# Patient Record
Sex: Male | Born: 1950 | Race: White | Hispanic: No | Smoking: Current every day smoker
Health system: Southern US, Community
[De-identification: ages and names within clinical notes are randomized; demographics above are authoritative.]

## PROBLEM LIST (undated history)

## (undated) DIAGNOSIS — J439 Emphysema, unspecified: Secondary | ICD-10-CM

## (undated) DIAGNOSIS — E785 Hyperlipidemia, unspecified: Secondary | ICD-10-CM

## (undated) DIAGNOSIS — J449 Chronic obstructive pulmonary disease, unspecified: Secondary | ICD-10-CM

## (undated) DIAGNOSIS — J45909 Unspecified asthma, uncomplicated: Secondary | ICD-10-CM

## (undated) HISTORY — PX: BACK SURGERY: SHX140

## (undated) HISTORY — DX: Hyperlipidemia, unspecified: E78.5

---

## 2003-07-12 ENCOUNTER — Emergency Department (HOSPITAL_COMMUNITY): Admission: EM | Admit: 2003-07-12 | Discharge: 2003-07-12 | Payer: Self-pay | Admitting: Emergency Medicine

## 2013-03-14 ENCOUNTER — Emergency Department (HOSPITAL_COMMUNITY): Payer: Self-pay

## 2013-03-14 ENCOUNTER — Emergency Department (HOSPITAL_COMMUNITY)
Admission: EM | Admit: 2013-03-14 | Discharge: 2013-03-14 | Disposition: A | Discharge status: Home / Self Care | Payer: Self-pay | Attending: Emergency Medicine | Admitting: Emergency Medicine

## 2013-03-14 ENCOUNTER — Encounter (HOSPITAL_COMMUNITY): Payer: Self-pay | Admitting: *Deleted

## 2013-03-14 DIAGNOSIS — J439 Emphysema, unspecified: Secondary | ICD-10-CM

## 2013-03-14 DIAGNOSIS — R109 Unspecified abdominal pain: Secondary | ICD-10-CM | POA: Insufficient documentation

## 2013-03-14 DIAGNOSIS — J45901 Unspecified asthma with (acute) exacerbation: Secondary | ICD-10-CM | POA: Insufficient documentation

## 2013-03-14 DIAGNOSIS — R05 Cough: Secondary | ICD-10-CM | POA: Insufficient documentation

## 2013-03-14 DIAGNOSIS — F17200 Nicotine dependence, unspecified, uncomplicated: Secondary | ICD-10-CM | POA: Insufficient documentation

## 2013-03-14 DIAGNOSIS — R059 Cough, unspecified: Secondary | ICD-10-CM | POA: Insufficient documentation

## 2013-03-14 DIAGNOSIS — R011 Cardiac murmur, unspecified: Secondary | ICD-10-CM | POA: Insufficient documentation

## 2013-03-14 DIAGNOSIS — K2990 Gastroduodenitis, unspecified, without bleeding: Secondary | ICD-10-CM | POA: Insufficient documentation

## 2013-03-14 DIAGNOSIS — K2970 Gastritis, unspecified, without bleeding: Secondary | ICD-10-CM | POA: Insufficient documentation

## 2013-03-14 HISTORY — DX: Unspecified asthma, uncomplicated: J45.909

## 2013-03-14 LAB — HEPATIC FUNCTION PANEL
ALT: 17 U/L (ref 0–53)
Alkaline Phosphatase: 74 U/L (ref 39–117)
Bilirubin, Direct: 0.1 mg/dL (ref 0.0–0.3)
Indirect Bilirubin: 0.6 mg/dL (ref 0.3–0.9)

## 2013-03-14 LAB — CBC
HCT: 42.5 % (ref 39.0–52.0)
MCHC: 35.1 g/dL (ref 30.0–36.0)
MCV: 97.7 fL (ref 78.0–100.0)
Platelets: 218 10*3/uL (ref 150–400)
RDW: 14.1 % (ref 11.5–15.5)
WBC: 8 10*3/uL (ref 4.0–10.5)

## 2013-03-14 LAB — BASIC METABOLIC PANEL
BUN: 16 mg/dL (ref 6–23)
Chloride: 104 mEq/L (ref 96–112)
Creatinine, Ser: 1.53 mg/dL — ABNORMAL HIGH (ref 0.50–1.35)
GFR calc Af Amer: 56 mL/min — ABNORMAL LOW (ref >90)
GFR calc non Af Amer: 48 mL/min — ABNORMAL LOW (ref >90)
Potassium: 3.8 mEq/L (ref 3.5–5.1)

## 2013-03-14 LAB — LIPASE, BLOOD: Lipase: 32 U/L (ref 11–59)

## 2013-03-14 LAB — RAPID URINE DRUG SCREEN, HOSP PERFORMED: Barbiturates: NOT DETECTED

## 2013-03-14 LAB — PRO B NATRIURETIC PEPTIDE: Pro B Natriuretic peptide (BNP): 90.7 pg/mL (ref 0–125)

## 2013-03-14 LAB — POCT I-STAT TROPONIN I: Troponin i, poc: 0.01 ng/mL (ref 0.00–0.08)

## 2013-03-14 MED ORDER — IPRATROPIUM BROMIDE 0.02 % IN SOLN
0.5000 mg | Freq: Once | RESPIRATORY_TRACT | Status: AC
  Administered 2013-03-14: 0.5 mg via RESPIRATORY_TRACT
  Filled 2013-03-14: qty 2.5

## 2013-03-14 MED ORDER — RANITIDINE HCL 150 MG PO TABS: 150.0000 mg | ORAL_TABLET | Freq: Two times a day (BID) | ORAL | Status: DC

## 2013-03-14 MED ORDER — ALBUTEROL SULFATE HFA 108 (90 BASE) MCG/ACT IN AERS: 1.0000 | INHALATION_SPRAY | Freq: Four times a day (QID) | RESPIRATORY_TRACT | Status: DC | PRN

## 2013-03-14 MED ORDER — ALBUTEROL SULFATE (5 MG/ML) 0.5% IN NEBU
2.5000 mg | INHALATION_SOLUTION | Freq: Once | RESPIRATORY_TRACT | Status: AC
  Administered 2013-03-14: 2.5 mg via RESPIRATORY_TRACT
  Filled 2013-03-14: qty 0.5

## 2013-03-14 MED ORDER — GI COCKTAIL ~~LOC~~
30.0000 mL | Freq: Once | ORAL | Status: AC
  Administered 2013-03-14: 30 mL via ORAL
  Filled 2013-03-14: qty 30

## 2013-03-14 NOTE — ED Notes (Addendum)
Pt reports sob for over one month, hx of asthma but does not use any inhalers at home. Also having mid chest pains that radiate into his stomach. ekg done at triage, no acute distress noted at this time. Reports last using cocaine two days ago.

## 2013-03-14 NOTE — ED Notes (Signed)
Ambulated pt. Pt stated that he felt fine while up walking around, Pt's O2 stayed at 95 HR stayed at 98

## 2013-03-14 NOTE — ED Provider Notes (Signed)
History     CSN: [id number]  Arrival date & time 03/14/13  1501   First MD Initiated Contact with Patient 03/14/13 1516      Chief Complaint  Patient presents with  . Chest Pain  . Shortness of Breath    (Consider location/radiation/quality/duration/timing/severity/associated sxs/prior treatment) HPI Comments: Pt with no medical hx comes in with cc of chest pain, abd pain dib. Pt admits to smoking 1 pack/day since age 62. He also admits to using crack occasionally, last use was 2 weeks ago. Pt reports having epigastric abd/chest pain and periumbilical abd pain for the past few days. The pain is constant, with intermittent worsening. The pain is not exertional, not worse with inspiration. Pt has a productive cough. No fevers, chills. He does admit to DIB, now walking just from one room to another gets him short of breath. He has no wheezing. Pt has no hx of PE, DVT, and no risk factors for the same. He does admit to some orthopnea, but there is no leg swelling, pnd. Pt doesn't see a physician regularly.  Patient is a 62 y.o. male presenting with chest pain and shortness of breath. The history is provided by the patient.  Chest Pain Associated symptoms: cough and shortness of breath   Associated symptoms: no dizziness, no fever and no headache   Shortness of Breath Associated symptoms: chest pain and cough   Associated symptoms: no fever, no headaches, no neck pain and no wheezing     Past Medical History  Diagnosis Date  . Asthma     History reviewed. No pertinent past surgical history.  History reviewed. No pertinent family history.  History  Substance Use Topics  . Smoking status: Current Every Day Smoker    Types: Cigarettes  . Smokeless tobacco: Not on file  . Alcohol Use: No     Comment: former alcoholic      Review of Systems  Constitutional: Negative for fever, chills and activity change.  HENT: Negative for neck pain.   Eyes: Negative for visual disturbance.   Respiratory: Positive for cough and shortness of breath. Negative for chest tightness and wheezing.   Cardiovascular: Positive for chest pain.  Gastrointestinal: Negative for abdominal distention.  Genitourinary: Negative for dysuria, enuresis and difficulty urinating.  Musculoskeletal: Negative for arthralgias.  Neurological: Negative for dizziness, light-headedness and headaches.  Psychiatric/Behavioral: Negative for confusion.    Allergies  Review of patient's allergies indicates no known allergies.  Home Medications  No current outpatient prescriptions on file.  BP 122/73  Pulse 59  Temp(Src) 98.7 F (37.1 C) (Oral)  Resp 21  SpO2 98%  Physical Exam  Nursing note and vitals reviewed. Constitutional: He is oriented to person, place, and time. He appears well-developed.  HENT:  Head: Normocephalic and atraumatic.  Eyes: Conjunctivae and EOM are normal. Pupils are equal, round, and reactive to light.  Neck: Normal range of motion. Neck supple. No JVD present.  Cardiovascular: Normal rate, regular rhythm and intact distal pulses.   Murmur heard. Pulmonary/Chest: Effort normal and breath sounds normal. No respiratory distress. He has no wheezes. He has no rales.  Abdominal: Soft. Bowel sounds are normal. He exhibits no distension. There is no tenderness. There is no rebound and no guarding.  Musculoskeletal: He exhibits no edema and no tenderness.  Neurological: He is alert and oriented to person, place, and time.  Skin: Skin is warm.    ED Course  Procedures (including critical care time)  Labs Reviewed  CBC -  Abnormal; Notable for the following:    MCH 34.3 (*)    All other components within normal limits  BASIC METABOLIC PANEL - Abnormal; Notable for the following:    Glucose, Bld 100 (*)    Creatinine, Ser 1.53 (*)    GFR calc non Af Amer 48 (*)    GFR calc Af Amer 56 (*)    All other components within normal limits  PRO B NATRIURETIC PEPTIDE  HEPATIC FUNCTION  PANEL  LIPASE, BLOOD  URINE RAPID DRUG SCREEN (HOSP PERFORMED)  POCT I-STAT TROPONIN I   Dg Chest 2 View  03/14/2013   *RADIOLOGY REPORT*  Clinical Data: Chest pain and shortness of breath.  Cough.  CHEST - 2 VIEW  Comparison: None available.  Findings: The heart size is normal.  Mild emphysematous changes are present.  No focal airspace disease is evident.  The visualized soft tissues and bony thorax are unremarkable.  IMPRESSION: 1.  Mild emphysema. 2.  No acute cardiopulmonary disease.   Original Report Authenticated By: Marin Roberts, M.D.   US Aorta  03/14/2013   *RADIOLOGY REPORT*  Clinical Data:  62 year old male with abdominal pain.  ULTRASOUND OF ABDOMINAL AORTA  Technique:  Ultrasound examination of the abdominal aorta was performed to evaluate for abdominal aortic aneurysm.  Comparison: None  Abdominal Aorta:  Mild ectasia of the proximal abdominal aorta is noted without aneurysm identified.        Maximum AP diameter:  2.2 cm.       Maximum TRV diameter:  2.2 cm.  IMPRESSION: No abdominal aortic aneurysm identified.   Original Report Authenticated By: Harmon Pier, M.D.     No diagnosis found.    MDM   Date: 03/14/2013  Rate: 79  Rhythm: normal sinus rhythm  QRS Axis: normal  Intervals: normal  ST/T Wave abnormalities: normal  Conduction Disutrbances: none  Narrative Interpretation: unremarkable   Differential diagnosis includes: ACS syndrome CHF exacerbation Valvular disorder Myocarditis Pericarditis Pericardial effusion Pneumonia Pleural effusion Pulmonary edema PE Anemia Musculoskeletal pain PUD/Gastritis COPD/Emphysema Pancreatitis  Pt comes in with cc of epigastric chest pain, abd pain - periumbilical and DIB. Pt has extensive smoking hx and cocaine use - which along with his age are the only cardiac risk factors he has. His initial ekg is WNL. Pt's exam is not indicative of fluid overload. No wheezing on lung exam -poor air entry appreciated. Will  get BNP, Troponin, LFTs, CXR and reassess.  7:45 PM Pt appears to have emphysema. BNP is nml, troponin x 1 is normal. 2nd trop pending. AAA was in the ddx with abd pain and smoking hx - that shows no AAA.  Will d/c with pulmonology f/u. Will give GI f/u as well and start on zantac.  The patient was counseled on the dangers of tobacco use, and was advised to quit.  Reviewed strategies to maximize success, including removing cigarettes and smoking materials from environment and support of family/friends.    Derwood Kaplan, MD 03/14/13 1947

## 2014-09-06 IMAGING — US US AORTA
1 series · 14 of 15 positions shown · non-contrast
Comparison: None

CLINICAL DATA: 61-year-old male with abdominal pain.

ULTRASOUND OF ABDOMINAL AORTA
TECHNIQUE: Ultrasound examination of the abdominal aorta was
performed to evaluate for abdominal aortic aneurysm.

[Series 1: us aorta · 0.26mm/px · 14 of 15 slices shown]
[im 1/15]
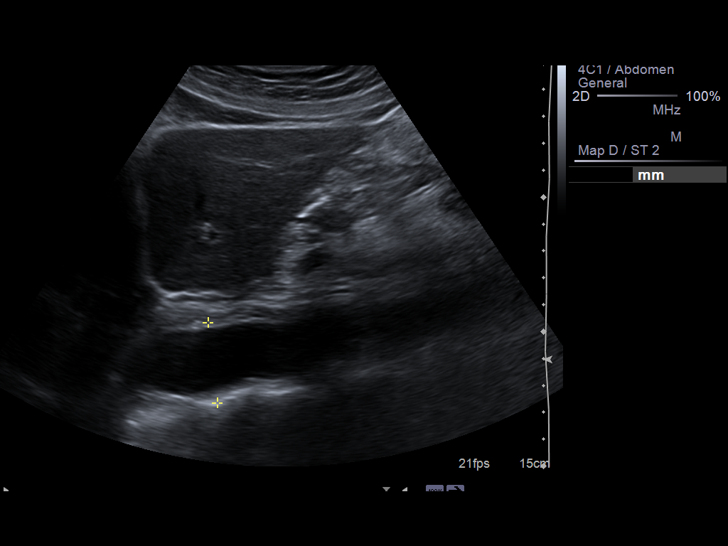
[im 2/15]
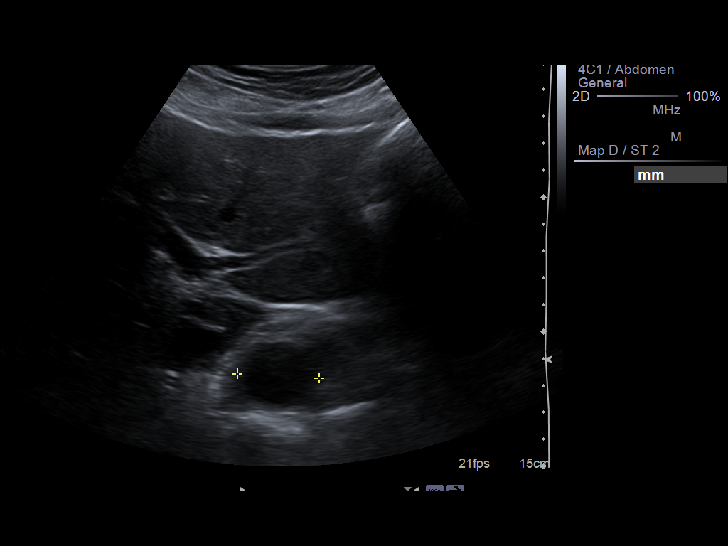
[im 3/15]
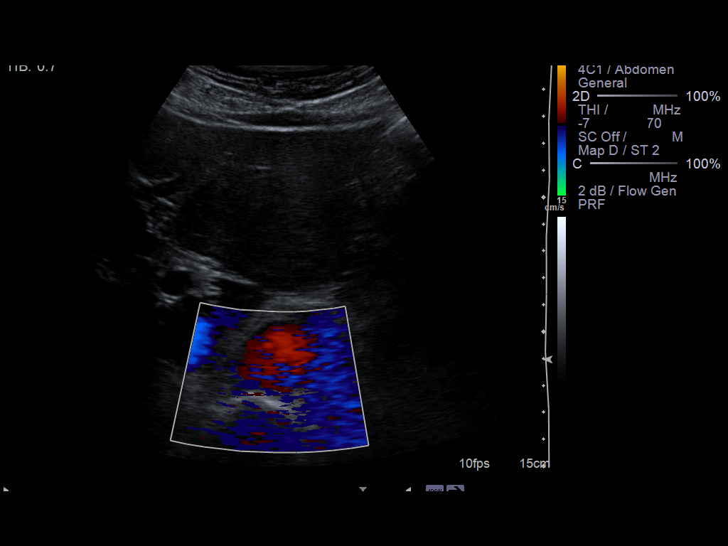
[im 4/15]
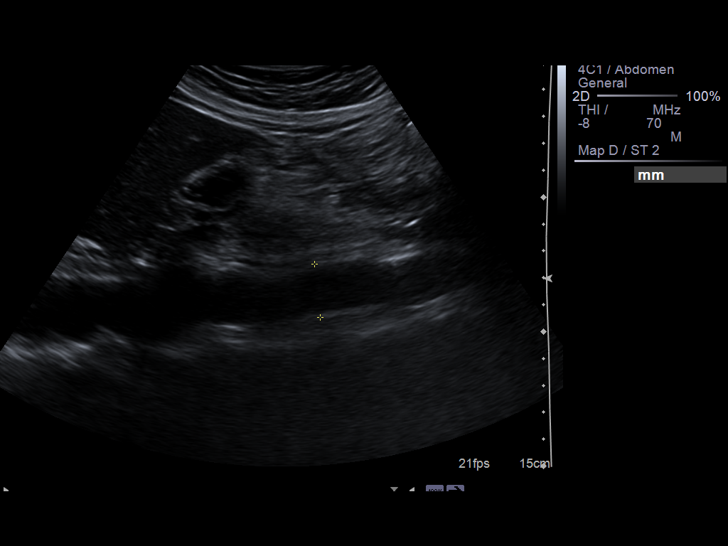
[im 5/15]
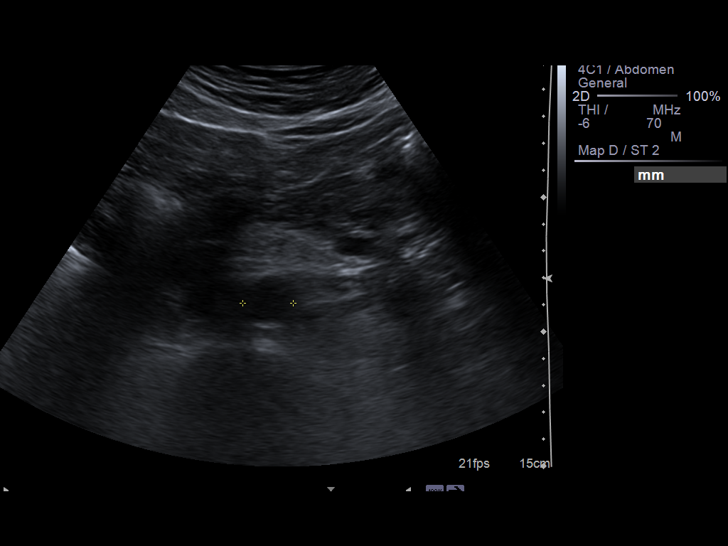
[im 6/15]
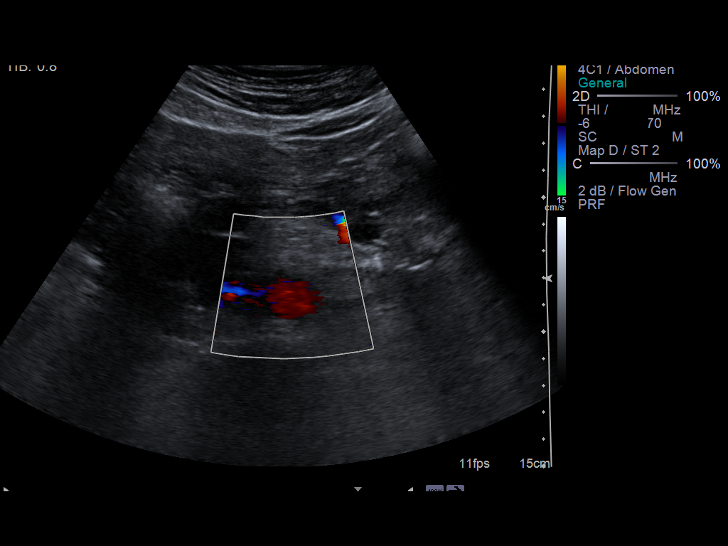
[im 7/15]
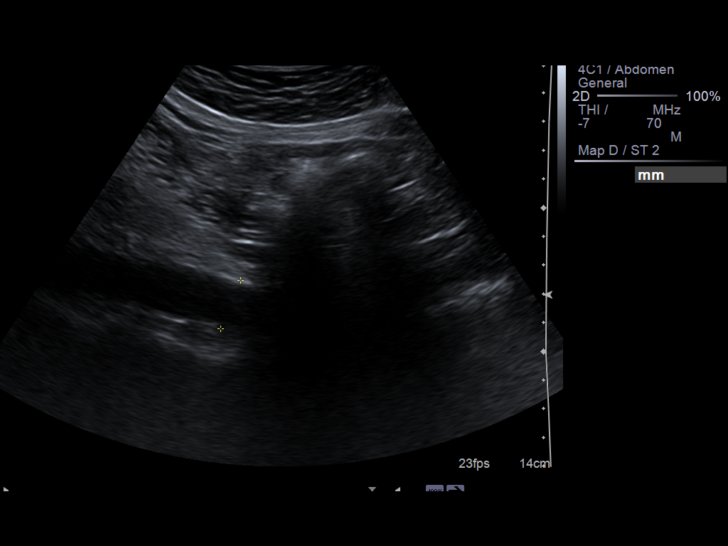
[im 9/15]
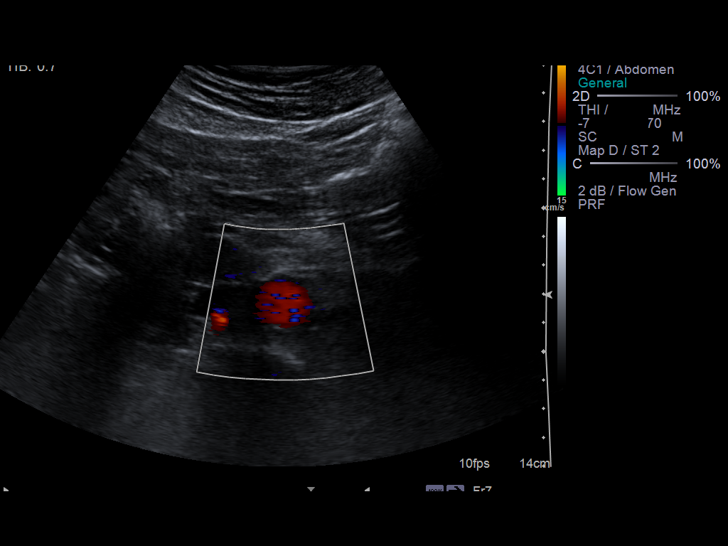
[im 10/15]
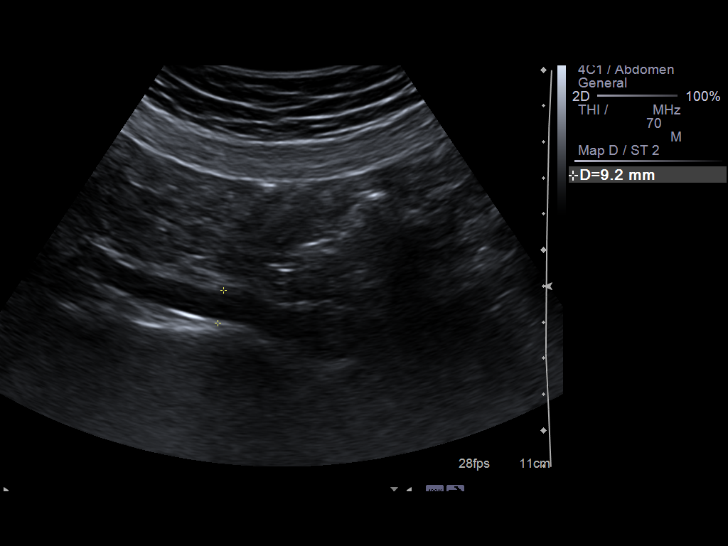
[im 11/15]
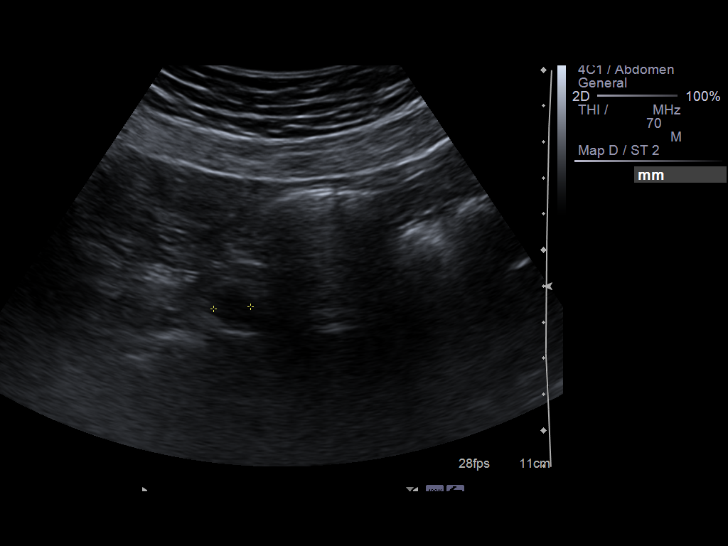
[im 12/15]
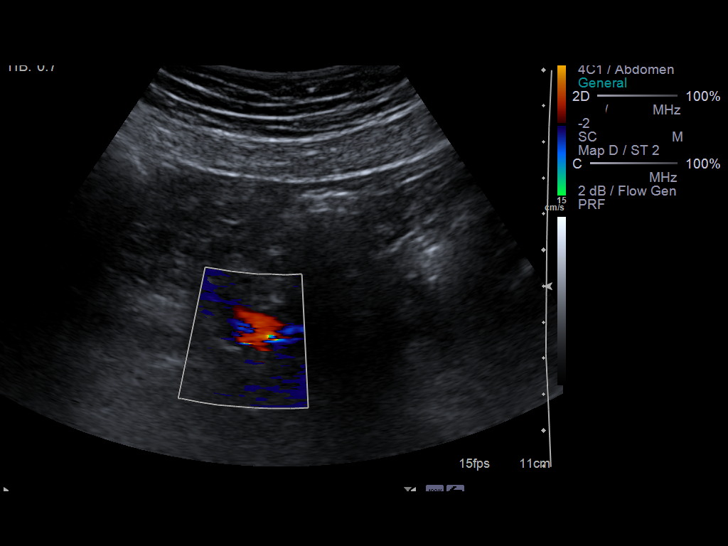
[im 13/15]
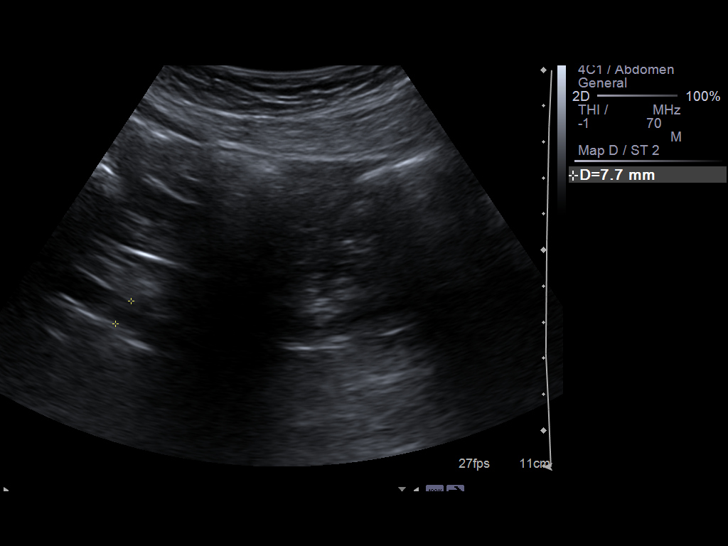
[im 14/15]
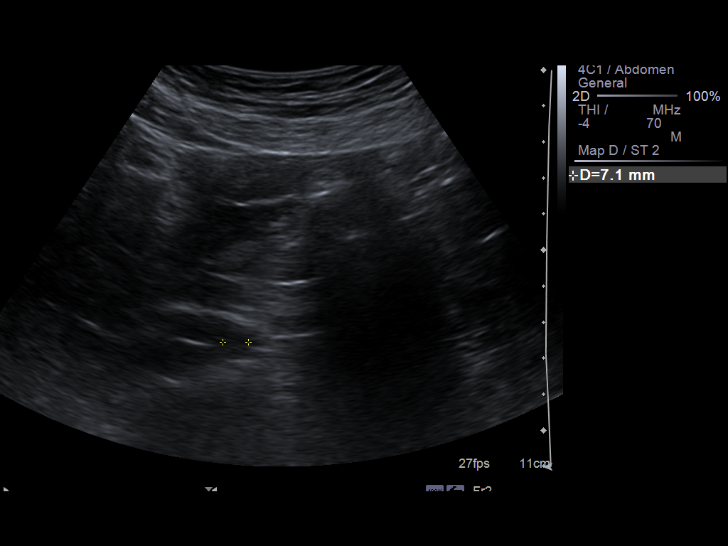
[im 15/15]
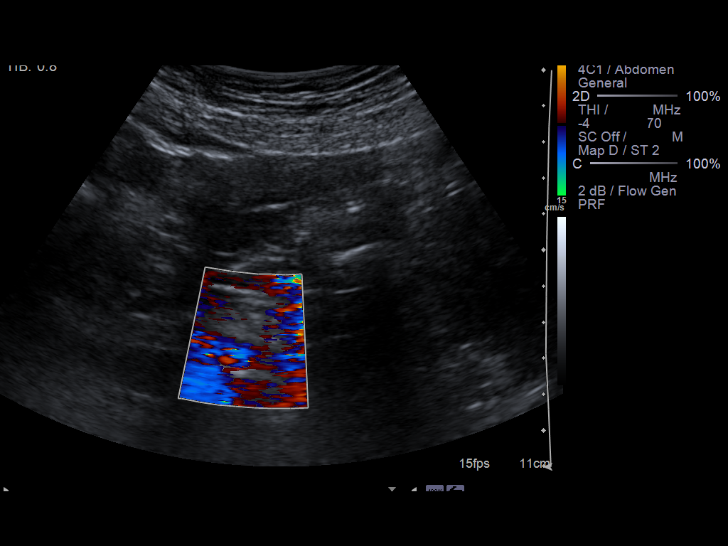

[14 of 15 positions shown; findings below may reference images not displayed]

Abdominal Aorta:  Mild ectasia of the proximal abdominal aorta is
noted without aneurysm identified.

      Maximum AP diameter:  2.2 cm.
      Maximum TRV diameter:  2.2 cm.
IMPRESSION: No abdominal aortic aneurysm identified.

## 2021-01-14 ENCOUNTER — Emergency Department
Admission: EM | Admit: 2021-01-14 | Discharge: 2021-01-14 | Discharge status: Against Medical Advice | Payer: Medicare Other | Attending: Student in an Organized Health Care Education/Training Program | Admitting: Student in an Organized Health Care Education/Training Program

## 2021-01-14 ENCOUNTER — Other Ambulatory Visit: Payer: Self-pay

## 2021-01-14 ENCOUNTER — Emergency Department: Payer: Medicare Other

## 2021-01-14 ENCOUNTER — Encounter: Payer: Self-pay | Admitting: Emergency Medicine

## 2021-01-14 DIAGNOSIS — J449 Chronic obstructive pulmonary disease, unspecified: Secondary | ICD-10-CM | POA: Insufficient documentation

## 2021-01-14 DIAGNOSIS — R0602 Shortness of breath: Secondary | ICD-10-CM | POA: Diagnosis not present

## 2021-01-14 DIAGNOSIS — F17210 Nicotine dependence, cigarettes, uncomplicated: Secondary | ICD-10-CM | POA: Insufficient documentation

## 2021-01-14 DIAGNOSIS — J45909 Unspecified asthma, uncomplicated: Secondary | ICD-10-CM | POA: Insufficient documentation

## 2021-01-14 HISTORY — DX: Chronic obstructive pulmonary disease, unspecified: J44.9

## 2021-01-14 HISTORY — DX: Emphysema, unspecified: J43.9

## 2021-01-14 LAB — CBC
HCT: 39.2 % (ref 39.0–52.0)
Hemoglobin: 13.4 g/dL (ref 13.0–17.0)
MCH: 33 pg (ref 26.0–34.0)
MCHC: 34.2 g/dL (ref 30.0–36.0)
MCV: 96.6 fL (ref 80.0–100.0)
Platelets: 246 10*3/uL (ref 150–400)
RBC: 4.06 MIL/uL — ABNORMAL LOW (ref 4.22–5.81)
RDW: 13.9 % (ref 11.5–15.5)
WBC: 6.8 10*3/uL (ref 4.0–10.5)
nRBC: 0 % (ref 0.0–0.2)

## 2021-01-14 LAB — COMPREHENSIVE METABOLIC PANEL
ALT: 19 U/L (ref 0–44)
AST: 26 U/L (ref 15–41)
Albumin: 4.1 g/dL (ref 3.5–5.0)
Alkaline Phosphatase: 50 U/L (ref 38–126)
Anion gap: 9 (ref 5–15)
BUN: 20 mg/dL (ref 8–23)
CO2: 23 mmol/L (ref 22–32)
Calcium: 9.3 mg/dL (ref 8.9–10.3)
Chloride: 104 mmol/L (ref 98–111)
Creatinine, Ser: 1.44 mg/dL — ABNORMAL HIGH (ref 0.61–1.24)
GFR, Estimated: 53 mL/min — ABNORMAL LOW (ref >60)
Glucose, Bld: 108 mg/dL — ABNORMAL HIGH (ref 70–99)
Potassium: 4.3 mmol/L (ref 3.5–5.1)
Sodium: 136 mmol/L (ref 135–145)
Total Bilirubin: 0.6 mg/dL (ref 0.3–1.2)
Total Protein: 7.5 g/dL (ref 6.5–8.1)

## 2021-01-14 LAB — TROPONIN I (HIGH SENSITIVITY): Troponin I (High Sensitivity): 8 ng/L (ref <18)

## 2021-01-14 LAB — BRAIN NATRIURETIC PEPTIDE: B Natriuretic Peptide: 37.4 pg/mL (ref 0.0–100.0)

## 2021-01-14 MED ORDER — IPRATROPIUM-ALBUTEROL 0.5-2.5 (3) MG/3ML IN SOLN
3.0000 mL | Freq: Once | RESPIRATORY_TRACT | Status: AC
  Administered 2021-01-14: 3 mL via RESPIRATORY_TRACT
  Filled 2021-01-14: qty 3

## 2021-01-14 MED ORDER — ALBUTEROL SULFATE HFA 108 (90 BASE) MCG/ACT IN AERS: 1.0000 | INHALATION_SPRAY | Freq: Four times a day (QID) | RESPIRATORY_TRACT | 0 refills | Status: DC | PRN

## 2021-01-14 MED ORDER — PREDNISONE 20 MG PO TABS: 40.0000 mg | ORAL_TABLET | Freq: Every day | ORAL | 0 refills | Status: AC

## 2021-01-14 MED ORDER — METHYLPREDNISOLONE SODIUM SUCC 125 MG IJ SOLR
125.0000 mg | Freq: Once | INTRAMUSCULAR | Status: AC
  Administered 2021-01-14: 125 mg via INTRAVENOUS
  Filled 2021-01-14: qty 2

## 2021-01-14 NOTE — ED Provider Notes (Signed)
[facility] Emergency Department Provider Note    Event Date/Time   First MD Initiated Contact with Patient 01/14/21 1138     (approximate)  I have reviewed the triage vital signs and the nursing notes.   HISTORY  Chief Complaint Chest Pain and Shortness of Breath    HPI Terry French is a 70 y.o. male below listed past medical history presents to the ER for worsening shortness of breath over the past few weeks becoming acutely worse this morning.  Is not been on any antibiotics or steroids recently.  States he is got 4 inhalers but he only uses them every now and then and does not feel like they are helping much.  Cannot provide any details as to what inhalers she has been using at home.  Denies any fevers or chills.  No known sick contacts.  No leg swelling.  No orthopnea.  Can feel himself wheezing.    Past Medical History:  Diagnosis Date  . Asthma   . COPD (chronic obstructive pulmonary disease) ([place])   . Emphysema lung ([place])    No family history on file. Past Surgical History:  Procedure Laterality Date  . BACK SURGERY     There are no problems to display for this patient.     Prior to Admission medications   Medication Sig Start Date End Date Taking? Authorizing Provider  predniSONE (DELTASONE) 20 MG tablet Take 2 tablets (40 mg total) by mouth daily for 5 days. 01/14/21 01/19/21 Yes Merlyn Lot, MD  albuterol (VENTOLIN HFA) 108 (90 Base) MCG/ACT inhaler Inhale 1-2 puffs into the lungs every 6 (six) hours as needed for wheezing. 01/14/21   Merlyn Lot, MD  ranitidine (ZANTAC) 150 MG tablet Take 1 tablet (150 mg total) by mouth 2 (two) times daily. 03/14/13   Varney Biles, MD    Allergies Patient has no known allergies.    Social History Social History   Tobacco Use  . Smoking status: Current Every Day Smoker    Types: Cigarettes  Substance Use Topics  . Alcohol use: No    Comment: former alcoholic  . Drug use: Yes     Types: Marijuana, Cocaine    Review of Systems Patient denies headaches, rhinorrhea, blurry vision, numbness, shortness of breath, chest pain, edema, cough, abdominal pain, nausea, vomiting, diarrhea, dysuria, fevers, rashes or hallucinations unless otherwise stated above in HPI. ____________________________________________   PHYSICAL EXAM:  VITAL SIGNS: Vitals:   01/14/21 1145 01/14/21 1311  BP: (!) 144/83   Pulse: 74   Resp: 16   Temp:    SpO2: 95% 97%    Constitutional: Alert and oriented.  Eyes: Conjunctivae are normal.  Head: Atraumatic. Nose: No congestion/rhinnorhea. Mouth/Throat: Mucous membranes are moist.   Neck: No stridor. Painless ROM.  Cardiovascular: Normal rate, regular rhythm. Grossly normal heart sounds.  Good peripheral circulation. Respiratory: mild tachypnea, prolonged exp phase, diffuse expiratory wheeze throughout Gastrointestinal: Soft and nontender. No distention. No abdominal bruits. No CVA tenderness. Genitourinary:  Musculoskeletal: No lower extremity tenderness nor edema.  No joint effusions. Neurologic:  Normal speech and language. No gross focal neurologic deficits are appreciated. No facial droop Skin:  Skin is warm, dry and intact. No rash noted. Psychiatric: Mood and affect are normal. Speech and behavior are normal.  ____________________________________________   LABS (all labs ordered are listed, but only abnormal results are displayed)  Results for orders placed or performed during the hospital encounter of 01/14/21 (from the past 24 hour(s))  CBC     Status: Abnormal   Collection Time: 01/14/21 11:43 AM  Result Value Ref Range   WBC 6.8 4.0 - 10.5 K/uL   RBC 4.06 (L) 4.22 - 5.81 MIL/uL   Hemoglobin 13.4 13.0 - 17.0 g/dL   HCT 39.2 39.0 - 52.0 %   MCV 96.6 80.0 - 100.0 fL   MCH 33.0 26.0 - 34.0 pg   MCHC 34.2 30.0 - 36.0 g/dL   RDW 13.9 11.5 - 15.5 %   Platelets 246 150 - 400 K/uL   nRBC 0.0 0.0 - 0.2 %  Comprehensive  metabolic panel     Status: Abnormal   Collection Time: 01/14/21 11:43 AM  Result Value Ref Range   Sodium 136 135 - 145 mmol/L   Potassium 4.3 3.5 - 5.1 mmol/L   Chloride 104 98 - 111 mmol/L   CO2 23 22 - 32 mmol/L   Glucose, Bld 108 (H) 70 - 99 mg/dL   BUN 20 8 - 23 mg/dL   Creatinine, Ser 1.44 (H) 0.61 - 1.24 mg/dL   Calcium 9.3 8.9 - 10.3 mg/dL   Total Protein 7.5 6.5 - 8.1 g/dL   Albumin 4.1 3.5 - 5.0 g/dL   AST 26 15 - 41 U/L   ALT 19 0 - 44 U/L   Alkaline Phosphatase 50 38 - 126 U/L   Total Bilirubin 0.6 0.3 - 1.2 mg/dL   GFR, Estimated 53 (L) >60 mL/min   Anion gap 9 5 - 15  Brain natriuretic peptide     Status: None   Collection Time: 01/14/21 11:44 AM  Result Value Ref Range   B Natriuretic Peptide 37.4 0.0 - 100.0 pg/mL  Troponin I (High Sensitivity)     Status: None   Collection Time: 01/14/21 11:45 AM  Result Value Ref Range   Troponin I (High Sensitivity) 8 <18 ng/L   ____________________________________________  EKG My review and personal interpretation at Time: 11:22   Indication: sob  Rate: 90  Rhythm: sinus Axis: normal Other: nonspecific st abn, no stemi ____________________________________________  RADIOLOGY  I personally reviewed all radiographic images ordered to evaluate for the above acute complaints and reviewed radiology reports and findings.  These findings were personally discussed with the patient.  Please see medical record for radiology report.  ____________________________________________   PROCEDURES  Procedure(s) performed:  Procedures    Critical Care performed: no ____________________________________________   INITIAL IMPRESSION / ASSESSMENT AND PLAN / ED COURSE  Pertinent labs & imaging results that were available during my care of the patient were reviewed by me and considered in my medical decision making (see chart for details).   DDX: Asthma, copd, CHF, pna, ptx, malignancy, Pe, anemia   WESLY WHISENANT is a 70 y.o.  who presents to the ED with presentation as described above.  Is clinical exam is most consistent with COPD emphysema.  EKG nonspecific changes.  I will give nebs as well as steroids.  Given his age and risk factors will order cardiac markers as well.  Have a lower suspicion for PE.  The patient will be placed on continuous pulse oximetry and telemetry for monitoring.  Laboratory evaluation will be sent to evaluate for the above complaints.     Clinical Course as of 01/14/21 1326  Fri Jan 14, 2021  1324 Patient notified nurse that he wants to leave.  I went and talked to the patient and based on his symptoms I did recommend the patient stay for further observation  serial enzymes cardiac monitoring make sure that his symptoms are improving.  Patient states that he is unwilling to wait and like to go home but does request a prescription for inhaler and prednisone.  For the patient that I I will prescribe these medications but leaving at this point in the work-up would be AGAINST MEDICAL ADVICE as I cannot exclude more insidious pathology such as cardiac etiology or worsening of his COPD in the leaving at this point could result in worsening of his symptoms, significant deterioration and even death.  Patient demonstrates understanding of this and states that he will sign out AMA. [PR]    Clinical Course User Index [PR] Merlyn Lot, MD    The patient was evaluated in Emergency Department today for the symptoms described in the history of present illness. He/she was evaluated in the context of the global COVID-19 pandemic, which necessitated consideration that the patient might be at risk for infection with the SARS-CoV-2 virus that causes COVID-19. Institutional protocols and algorithms that pertain to the evaluation of patients at risk for COVID-19 are in a state of rapid change based on information released by regulatory bodies including the CDC and federal and state organizations. These policies and  algorithms were followed during the patient's care in the ED.  As part of my medical decision making, I reviewed the following data within the [place] notes reviewed and incorporated, Labs reviewed, notes from prior ED visits and [place] Controlled Substance Database   ____________________________________________   FINAL CLINICAL IMPRESSION(S) / ED DIAGNOSES  Final diagnoses:  Shortness of breath      NEW MEDICATIONS STARTED DURING THIS VISIT:  New Prescriptions   PREDNISONE (DELTASONE) 20 MG TABLET    Take 2 tablets (40 mg total) by mouth daily for 5 days.     Note:  This document was prepared using Dragon voice recognition software and may include unintentional dictation errors.    Merlyn Lot, MD 01/14/21 1327

## 2021-01-14 NOTE — ED Triage Notes (Signed)
Pt c/o substernal CP that has been intermittent x 3 weeks. Pt states burning sensation to his lungs. Pt states hx of COPD and emphysema, pt noted to be tachypneic on arrival to ED.

## 2021-01-27 ENCOUNTER — Ambulatory Visit (INDEPENDENT_AMBULATORY_CARE_PROVIDER_SITE_OTHER): Payer: Medicare Other | Admitting: Nurse Practitioner

## 2021-01-27 ENCOUNTER — Encounter: Payer: Self-pay | Admitting: Nurse Practitioner

## 2021-01-27 ENCOUNTER — Ambulatory Visit
Admission: RE | Admit: 2021-01-27 | Discharge: 2021-01-27 | Disposition: A | Discharge status: Home / Self Care | Payer: Medicare Other | Source: Ambulatory Visit | Attending: Nurse Practitioner | Admitting: Nurse Practitioner

## 2021-01-27 ENCOUNTER — Other Ambulatory Visit: Payer: Self-pay

## 2021-01-27 ENCOUNTER — Ambulatory Visit
Admission: RE | Admit: 2021-01-27 | Discharge: 2021-01-27 | Disposition: A | Discharge status: Home / Self Care | Payer: Medicare Other | Source: Home / Self Care | Attending: Nurse Practitioner | Admitting: Nurse Practitioner

## 2021-01-27 VITALS — BP 140/79 | HR 77 | Temp 97.3°F | Ht 65.47 in | Wt 154.2 lb

## 2021-01-27 DIAGNOSIS — J441 Chronic obstructive pulmonary disease with (acute) exacerbation: Secondary | ICD-10-CM

## 2021-01-27 DIAGNOSIS — Z7689 Persons encountering health services in other specified circumstances: Secondary | ICD-10-CM | POA: Diagnosis not present

## 2021-01-27 MED ORDER — SPIRIVA RESPIMAT 2.5 MCG/ACT IN AERS: 2.0000 | INHALATION_SPRAY | Freq: Every day | RESPIRATORY_TRACT | 1 refills | Status: DC

## 2021-01-27 MED ORDER — ALBUTEROL SULFATE HFA 108 (90 BASE) MCG/ACT IN AERS: 1.0000 | INHALATION_SPRAY | Freq: Four times a day (QID) | RESPIRATORY_TRACT | 1 refills | Status: DC | PRN

## 2021-01-27 NOTE — Progress Notes (Signed)
BP 140/79   Pulse 77   Temp (!) 97.3 F (36.3 C)   Ht 5' 5.47" (1.663 m)   Wt 154 lb 4 oz (70 kg)   SpO2 97%   BMI 25.30 kg/m    Subjective:    Patient ID: Terry French, male    DOB: 1951/07/09, 70 y.o.   MRN: [id number]  HPI: Terry French is a 70 y.o. male  Chief Complaint  Patient presents with  . Establish Care  . COPD   Patient presents to clinic to establish care with new PCP.  Patient reports a history of COPD has been a smoker for about 44 years.  Patient reports that he drinks 3-4 cups of coffee everyday.  He does drink a lot of water also.  Patient was a former alcoholic and cocaine abuse.  Currently only smokes marjiuana.   Patient denies a history of: Hypertension, Elevated Cholesterol, Thyroid problems, Diabetes, Depression, Anxiety, Neurological problems, and Abdominal problems.    Denies HA, CP, dizziness, palpitations, visual changes, and lower extremity swelling.   COPD Completed course of steroids from the hospital. It was recommended that he stay for observation but he left AMA. States breathing has improved some but not completely.  He does have SOB without having an exacerbation.  COPD status: uncontrolled Satisfied with current treatment?: no current treatment Oxygen use: no Dyspnea frequency: yes Cough frequency: yes Rescue inhaler frequency:  Whenever he needs Limitation of activity: yes Productive cough: yes Last Spirometry: Never Pneumovax: not up to date but will get in the future. Influenza: Not up to Date  Relevant past medical, surgical, family and social history reviewed and updated as indicated. Interim medical history since our last visit reviewed. Allergies and medications reviewed and updated.  Review of Systems  Eyes: Negative for visual disturbance.  Respiratory: Positive for shortness of breath.   Cardiovascular: Negative for chest pain and leg swelling.  Neurological: Negative for light-headedness and headaches.    Per HPI  unless specifically indicated above     Objective:    BP 140/79   Pulse 77   Temp (!) 97.3 F (36.3 C)   Ht 5' 5.47" (1.663 m)   Wt 154 lb 4 oz (70 kg)   SpO2 97%   BMI 25.30 kg/m   Wt Readings from Last 3 Encounters:  01/27/21 154 lb 4 oz (70 kg)  01/14/21 160 lb (72.6 kg)    Physical Exam Vitals and nursing note reviewed.  Constitutional:      General: He is not in acute distress.    Appearance: Normal appearance. He is not ill-appearing, toxic-appearing or diaphoretic.  HENT:     Head: Normocephalic.     Right Ear: External ear normal.     Left Ear: External ear normal.     Nose: Nose normal. No congestion or rhinorrhea.     Mouth/Throat:     Mouth: Mucous membranes are moist.  Eyes:     General:        Right eye: No discharge.        Left eye: No discharge.     Extraocular Movements: Extraocular movements intact.     Conjunctiva/sclera: Conjunctivae normal.     Pupils: Pupils are equal, round, and reactive to light.  Cardiovascular:     Rate and Rhythm: Normal rate and regular rhythm.     Heart sounds: No murmur heard.   Pulmonary:     Effort: Pulmonary effort is normal. No tachypnea, accessory  muscle usage or respiratory distress.     Breath sounds: Decreased air movement present. Examination of the right-upper field reveals decreased breath sounds. Examination of the right-lower field reveals decreased breath sounds. Decreased breath sounds present. No wheezing, rhonchi or rales.  Abdominal:     General: Abdomen is flat. Bowel sounds are normal.  Musculoskeletal:     Cervical back: Normal range of motion and neck supple.  Skin:    General: Skin is warm and dry.     Capillary Refill: Capillary refill takes less than 2 seconds.  Neurological:     General: No focal deficit present.     Mental Status: He is alert and oriented to person, place, and time.  Psychiatric:        Mood and Affect: Mood normal.        Behavior: Behavior normal.        Thought  Content: Thought content normal.        Judgment: Judgment normal.     Results for orders placed or performed during the hospital encounter of 01/14/21  CBC  Result Value Ref Range   WBC 6.8 4.0 - 10.5 K/uL   RBC 4.06 (L) 4.22 - 5.81 MIL/uL   Hemoglobin 13.4 13.0 - 17.0 g/dL   HCT 39.2 39.0 - 52.0 %   MCV 96.6 80.0 - 100.0 fL   MCH 33.0 26.0 - 34.0 pg   MCHC 34.2 30.0 - 36.0 g/dL   RDW 13.9 11.5 - 15.5 %   Platelets 246 150 - 400 K/uL   nRBC 0.0 0.0 - 0.2 %  Comprehensive metabolic panel  Result Value Ref Range   Sodium 136 135 - 145 mmol/L   Potassium 4.3 3.5 - 5.1 mmol/L   Chloride 104 98 - 111 mmol/L   CO2 23 22 - 32 mmol/L   Glucose, Bld 108 (H) 70 - 99 mg/dL   BUN 20 8 - 23 mg/dL   Creatinine, Ser 1.44 (H) 0.61 - 1.24 mg/dL   Calcium 9.3 8.9 - 10.3 mg/dL   Total Protein 7.5 6.5 - 8.1 g/dL   Albumin 4.1 3.5 - 5.0 g/dL   AST 26 15 - 41 U/L   ALT 19 0 - 44 U/L   Alkaline Phosphatase 50 38 - 126 U/L   Total Bilirubin 0.6 0.3 - 1.2 mg/dL   GFR, Estimated 53 (L) >60 mL/min   Anion gap 9 5 - 15  Brain natriuretic peptide  Result Value Ref Range   B Natriuretic Peptide 37.4 0.0 - 100.0 pg/mL  Troponin I (High Sensitivity)  Result Value Ref Range   Troponin I (High Sensitivity) 8 <18 ng/L      Assessment & Plan:   Problem List Items Addressed This Visit   None   Visit Diagnoses    COPD exacerbation (HCC)    -  Primary   Begin Spiriva daily. Refilled albuterol. Spirometry when breathing is better controlled. Xray ordered. Will repeat steroids if necessary. Return in 1 week.   Relevant Medications   albuterol (VENTOLIN HFA) 108 (90 Base) MCG/ACT inhaler   Tiotropium Bromide Monohydrate (SPIRIVA RESPIMAT) 2.5 MCG/ACT AERS   Other Relevant Orders   DG Chest 2 View   Encounter to establish care           Follow up plan: Return in about 1 week (around 02/03/2021) for COPD check .   A total of 40 minutes were spent on this encounter today.  When total time is  documented, this  includes both the face-to-face and non-face-to-face time personally spent before, during and after the visit on the date of the encounter.

## 2021-01-28 ENCOUNTER — Telehealth: Payer: Self-pay | Admitting: Nurse Practitioner

## 2021-01-28 DIAGNOSIS — J439 Emphysema, unspecified: Secondary | ICD-10-CM

## 2021-01-28 DIAGNOSIS — I700 Atherosclerosis of aorta: Secondary | ICD-10-CM | POA: Insufficient documentation

## 2021-01-28 NOTE — Progress Notes (Signed)
Please let patient know that his chest xray does not show pneumonia.  It does she that he has emphysema which is likely why he is experiencing the shortness of breath.  Continue with inhaler's as prescribed.  We will follow up as discussed.

## 2021-01-28 NOTE — Telephone Encounter (Signed)
Updated problem list based on chest xray results.

## 2021-02-03 ENCOUNTER — Ambulatory Visit: Payer: Medicare Other | Admitting: Nurse Practitioner

## 2021-02-03 NOTE — Progress Notes (Deleted)
   There were no vitals taken for this visit.   Subjective:    Patient ID: Terry French, male    DOB: 1951/06/10, 70 y.o.   MRN: [id number]  HPI: Terry French is a 70 y.o. male  No chief complaint on file.  COPD  Relevant past medical, surgical, family and social history reviewed and updated as indicated. Interim medical history since our last visit reviewed. Allergies and medications reviewed and updated.  Review of Systems  Per HPI unless specifically indicated above     Objective:    There were no vitals taken for this visit.  Wt Readings from Last 3 Encounters:  01/27/21 154 lb 4 oz (70 kg)  01/14/21 160 lb (72.6 kg)    Physical Exam  Results for orders placed or performed during the hospital encounter of 01/14/21  CBC  Result Value Ref Range   WBC 6.8 4.0 - 10.5 K/uL   RBC 4.06 (L) 4.22 - 5.81 MIL/uL   Hemoglobin 13.4 13.0 - 17.0 g/dL   HCT 39.2 39.0 - 52.0 %   MCV 96.6 80.0 - 100.0 fL   MCH 33.0 26.0 - 34.0 pg   MCHC 34.2 30.0 - 36.0 g/dL   RDW 13.9 11.5 - 15.5 %   Platelets 246 150 - 400 K/uL   nRBC 0.0 0.0 - 0.2 %  Comprehensive metabolic panel  Result Value Ref Range   Sodium 136 135 - 145 mmol/L   Potassium 4.3 3.5 - 5.1 mmol/L   Chloride 104 98 - 111 mmol/L   CO2 23 22 - 32 mmol/L   Glucose, Bld 108 (H) 70 - 99 mg/dL   BUN 20 8 - 23 mg/dL   Creatinine, Ser 1.44 (H) 0.61 - 1.24 mg/dL   Calcium 9.3 8.9 - 10.3 mg/dL   Total Protein 7.5 6.5 - 8.1 g/dL   Albumin 4.1 3.5 - 5.0 g/dL   AST 26 15 - 41 U/L   ALT 19 0 - 44 U/L   Alkaline Phosphatase 50 38 - 126 U/L   Total Bilirubin 0.6 0.3 - 1.2 mg/dL   GFR, Estimated 53 (L) >60 mL/min   Anion gap 9 5 - 15  Brain natriuretic peptide  Result Value Ref Range   B Natriuretic Peptide 37.4 0.0 - 100.0 pg/mL  Troponin I (High Sensitivity)  Result Value Ref Range   Troponin I (High Sensitivity) 8 <18 ng/L      Assessment & Plan:   Problem List Items Addressed This Visit      Respiratory   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

## 2021-02-17 ENCOUNTER — Telehealth: Payer: Self-pay

## 2021-02-17 NOTE — Telephone Encounter (Signed)
Please let pt know his apt for 02/18/2021 has to be cancled and needs to be rescheduled as provider is sick.

## 2021-02-18 ENCOUNTER — Ambulatory Visit: Payer: Medicare Other | Admitting: Nurse Practitioner

## 2021-05-17 ENCOUNTER — Telehealth: Payer: Self-pay | Admitting: Nurse Practitioner

## 2021-05-17 NOTE — Telephone Encounter (Signed)
Opened in error

## 2021-05-17 NOTE — Telephone Encounter (Signed)
Left message for patient to call back and schedule Medicare Annual Wellness Visit (AWV) to be done virtually or by telephone.    No hx of AWV eligible as of 07/10/15    Please schedule at anytime with [facility]-Nurse Health Advisor.        50 Minutes appointment    Any questions, please call me at [phone]

## 2021-09-09 ENCOUNTER — Telehealth: Payer: Self-pay | Admitting: Nurse Practitioner

## 2021-09-09 NOTE — Telephone Encounter (Signed)
Copied from [place] [id number]. Topic: Medicare AWV >> Sep 09, 2021  2:29 PM Lavonia Drafts wrote: Reason for CRM:  N/A, unable to leave a  message for patient to call back and schedule Medicare Annual Wellness Visit (AWV) to be done virtually or by telephone.  No hx of AWV eligible as of 07/10/15  Please schedule at anytime with [facility]-Nurse Health Advisor.      57 Minutes appointment   Any questions, please call me at [phone]

## 2021-11-16 ENCOUNTER — Telehealth: Payer: Self-pay | Admitting: Nurse Practitioner

## 2021-11-16 NOTE — Telephone Encounter (Signed)
Copied from [place] [id number]. Topic: Medicare AWV >> Nov 16, 2021  8:42 AM Lavonia Drafts wrote: Reason for CRM:  N/A unable to leave a message for patient to call back and schedule Medicare Annual Wellness Visit (AWV) to be done virtually or by telephone.  No hx of AWV eligible as of 07/10/15  Please schedule at anytime with [facility]-Nurse Health Advisor.      90 Minutes appointment   Any questions, please call me at [phone]

## 2022-02-10 NOTE — Progress Notes (Signed)
? ?BP 128/78   Pulse 70   Temp (!) 97.5 ?F (36.4 ?C) (Oral)   Wt 160 lb 6.4 oz (72.8 kg)   SpO2 94%   BMI 26.31 kg/m?   ? ?Subjective:  ? ? Patient ID: Terry French, male    DOB: Aug 28, 1951, 71 y.o.   MRN: [id number] ? ?HPI: ?Terry French is a 71 y.o. male ? ?Chief Complaint  ?Patient presents with  ? COPD  ? Follow-up  ? ?COPD ?COPD status: uncontrolled ?Satisfied with current treatment?: no ?Oxygen use: no ?Dyspnea frequency: all the time ?Cough frequency: daily ?Rescue inhaler frequency:  not using because he's been out  ?Limitation of activity: no ?Productive cough: yes ?Last Spirometry:  ?Pneumovax: Not up to Date ?Influenza: Not up to Date ? ? ? ?Relevant past medical, surgical, family and social history reviewed and updated as indicated. Interim medical history since our last visit reviewed. ?Allergies and medications reviewed and updated. ? ?Review of Systems  ?Respiratory:  Positive for cough and shortness of breath.   ? ?Per HPI unless specifically indicated above ? ?   ?Objective:  ?  ?BP 128/78   Pulse 70   Temp (!) 97.5 ?F (36.4 ?C) (Oral)   Wt 160 lb 6.4 oz (72.8 kg)   SpO2 94%   BMI 26.31 kg/m?   ?Wt Readings from Last 3 Encounters:  ?02/13/22 160 lb 6.4 oz (72.8 kg)  ?01/27/21 154 lb 4 oz (70 kg)  ?01/14/21 160 lb (72.6 kg)  ?  ?Physical Exam ?Vitals and nursing note reviewed.  ?Constitutional:   ?   General: He is not in acute distress. ?   Appearance: Normal appearance. He is not ill-appearing, toxic-appearing or diaphoretic.  ?HENT:  ?   Head: Normocephalic.  ?   Right Ear: External ear normal.  ?   Left Ear: External ear normal.  ?   Nose: Nose normal. No congestion or rhinorrhea.  ?   Mouth/Throat:  ?   Mouth: Mucous membranes are moist.  ?Eyes:  ?   General:     ?   Right eye: No discharge.     ?   Left eye: No discharge.  ?   Extraocular Movements: Extraocular movements intact.  ?   Conjunctiva/sclera: Conjunctivae normal.  ?   Pupils: Pupils are equal, round, and reactive to light.   ?Cardiovascular:  ?   Rate and Rhythm: Normal rate and regular rhythm.  ?   Heart sounds: No murmur heard. ?Pulmonary:  ?   Effort: Pulmonary effort is normal. No respiratory distress.  ?   Breath sounds: Normal breath sounds. No wheezing, rhonchi or rales.  ?Abdominal:  ?   General: Abdomen is flat. Bowel sounds are normal.  ?Musculoskeletal:  ?   Cervical back: Normal range of motion and neck supple.  ?Skin: ?   General: Skin is warm and dry.  ?   Capillary Refill: Capillary refill takes less than 2 seconds.  ?Neurological:  ?   General: No focal deficit present.  ?   Mental Status: He is alert and oriented to person, place, and time.  ?Psychiatric:     ?   Mood and Affect: Mood normal.     ?   Behavior: Behavior normal.     ?   Thought Content: Thought content normal.     ?   Judgment: Judgment normal.  ? ? ?Results for orders placed or performed during the hospital encounter of 01/14/21  ?CBC  ?Result  Value Ref Range  ? WBC 6.8 4.0 - 10.5 K/uL  ? RBC 4.06 (L) 4.22 - 5.81 MIL/uL  ? Hemoglobin 13.4 13.0 - 17.0 g/dL  ? HCT 39.2 39.0 - 52.0 %  ? MCV 96.6 80.0 - 100.0 fL  ? MCH 33.0 26.0 - 34.0 pg  ? MCHC 34.2 30.0 - 36.0 g/dL  ? RDW 13.9 11.5 - 15.5 %  ? Platelets 246 150 - 400 K/uL  ? nRBC 0.0 0.0 - 0.2 %  ?Comprehensive metabolic panel  ?Result Value Ref Range  ? Sodium 136 135 - 145 mmol/L  ? Potassium 4.3 3.5 - 5.1 mmol/L  ? Chloride 104 98 - 111 mmol/L  ? CO2 23 22 - 32 mmol/L  ? Glucose, Bld 108 (H) 70 - 99 mg/dL  ? BUN 20 8 - 23 mg/dL  ? Creatinine, Ser 1.44 (H) 0.61 - 1.24 mg/dL  ? Calcium 9.3 8.9 - 10.3 mg/dL  ? Total Protein 7.5 6.5 - 8.1 g/dL  ? Albumin 4.1 3.5 - 5.0 g/dL  ? AST 26 15 - 41 U/L  ? ALT 19 0 - 44 U/L  ? Alkaline Phosphatase 50 38 - 126 U/L  ? Total Bilirubin 0.6 0.3 - 1.2 mg/dL  ? GFR, Estimated 53 (L) >60 mL/min  ? Anion gap 9 5 - 15  ?Brain natriuretic peptide  ?Result Value Ref Range  ? B Natriuretic Peptide 37.4 0.0 - 100.0 pg/mL  ?Troponin I (High Sensitivity)  ?Result Value Ref Range   ? Troponin I (High Sensitivity) 8 <18 ng/L  ? ?   ?Assessment & Plan:  ? ?Problem List Items Addressed This Visit   ? ?  ? Cardiovascular and Mediastinum  ? Atherosclerosis of aorta ([place]) - Primary  ?  Chronic. Labs ordered today. Will make recommendations based on lab results.  ? ?  ?  ? Relevant Orders  ? Comp Met (CMET)  ?  ? Respiratory  ? Emphysema lung ([place])  ?  Chronic. Not well controlled. Has been out of Spiriva and Albuterol.  Does not feel like Spiriva is enough for him.  Having a lot of SOB without medications.  Spirometry done in office today shows FEV1 of 56%.  Patient does not have Medicare part D. Will refer to CCM for assistance with inhaler.  Patient provided sample of Breztri given in office today.  Refilled Albuterol. Follow up in 1 month for reevaluation.  Patient declined Pneumovax.  ? ?  ?  ? Relevant Medications  ? albuterol (VENTOLIN HFA) 108 (90 Base) MCG/ACT inhaler  ? Budeson-Glycopyrrol-Formoterol (BREZTRI AEROSPHERE) 160-9-4.8 MCG/ACT AERO  ? Other Relevant Orders  ? Comp Met (CMET)  ? Spirometry with graph (Completed)  ? Spirometry with graph (Completed)  ? AMB Referral to [facility]  ? ?Other Visit Diagnoses   ? ? Screening for ischemic heart disease      ? Relevant Orders  ? Lipid Profile  ? ?  ?  ? ?Follow up plan: ?Return in about 1 month (around 03/16/2022) for COPD. ? ? ? ? ? ?

## 2022-02-13 ENCOUNTER — Ambulatory Visit (INDEPENDENT_AMBULATORY_CARE_PROVIDER_SITE_OTHER): Payer: Medicare Other | Admitting: Nurse Practitioner

## 2022-02-13 ENCOUNTER — Encounter: Payer: Self-pay | Admitting: Nurse Practitioner

## 2022-02-13 ENCOUNTER — Telehealth: Payer: Self-pay

## 2022-02-13 VITALS — BP 128/78 | HR 70 | Temp 97.5°F | Wt 160.4 lb

## 2022-02-13 DIAGNOSIS — J439 Emphysema, unspecified: Secondary | ICD-10-CM

## 2022-02-13 DIAGNOSIS — Z136 Encounter for screening for cardiovascular disorders: Secondary | ICD-10-CM

## 2022-02-13 DIAGNOSIS — I700 Atherosclerosis of aorta: Secondary | ICD-10-CM

## 2022-02-13 MED ORDER — ALBUTEROL SULFATE HFA 108 (90 BASE) MCG/ACT IN AERS: 1.0000 | INHALATION_SPRAY | Freq: Four times a day (QID) | RESPIRATORY_TRACT | 1 refills | Status: DC | PRN

## 2022-02-13 MED ORDER — ALBUTEROL SULFATE (2.5 MG/3ML) 0.083% IN NEBU
2.5000 mg | INHALATION_SOLUTION | Freq: Once | RESPIRATORY_TRACT | Status: AC
  Administered 2022-02-13: 2.5 mg via RESPIRATORY_TRACT

## 2022-02-13 MED ORDER — BREZTRI AEROSPHERE 160-9-4.8 MCG/ACT IN AERO: 2.0000 | INHALATION_SPRAY | Freq: Two times a day (BID) | RESPIRATORY_TRACT | 11 refills | Status: DC

## 2022-02-13 NOTE — Chronic Care Management (AMB) (Signed)
  Chronic Care Management   Outreach Note  02/13/2022 Name: Terry French MRN: [id number] DOB: [birth year]  Terry French is a 71 y.o. year old male who is a primary care patient of Jon Billings, NP. I reached out to Terry French by phone today in response to a referral sent by Terry French primary care provider.  An unsuccessful telephone outreach was attempted today. The patient was referred to the case management team for assistance with care management and care coordination.   Follow Up Plan: A HIPAA compliant phone message was left for the patient providing contact information and requesting a return call.  The care management team will reach out to the patient again over the next 7 days.  If patient returns call to provider office, please advise to call [facility] * at [phone]*  Noreene Larsson, [place], [place] Management  [place], [place] [address] Direct Dial: [phone] [email or web address]: [email or web address]

## 2022-02-13 NOTE — Progress Notes (Signed)
Results discussed with patient during visit.

## 2022-02-13 NOTE — Assessment & Plan Note (Signed)
Chronic.  Labs ordered today. Will make recommendations based on lab results.  

## 2022-02-13 NOTE — Assessment & Plan Note (Signed)
Chronic. Not well controlled. Has been out of Spiriva and Albuterol.  Does not feel like Spiriva is enough for him.  Having a lot of SOB without medications.  Spirometry done in office today shows FEV1 of 56%.  Patient does not have Medicare part D. Will refer to CCM for assistance with inhaler.  Patient provided sample of Breztri given in office today.  Refilled Albuterol. Follow up in 1 month for reevaluation.  Patient declined Pneumovax.  ?

## 2022-02-14 LAB — COMPREHENSIVE METABOLIC PANEL
ALT: 20 IU/L (ref 0–44)
AST: 18 IU/L (ref 0–40)
Albumin/Globulin Ratio: 1.5 (ref 1.2–2.2)
Albumin: 4.4 g/dL (ref 3.8–4.8)
Alkaline Phosphatase: 72 IU/L (ref 44–121)
BUN/Creatinine Ratio: 13 (ref 10–24)
BUN: 19 mg/dL (ref 8–27)
Bilirubin Total: 0.4 mg/dL (ref 0.0–1.2)
CO2: 21 mmol/L (ref 20–29)
Calcium: 9.5 mg/dL (ref 8.6–10.2)
Chloride: 102 mmol/L (ref 96–106)
Creatinine, Ser: 1.42 mg/dL — ABNORMAL HIGH (ref 0.76–1.27)
Globulin, Total: 2.9 g/dL (ref 1.5–4.5)
Glucose: 100 mg/dL — ABNORMAL HIGH (ref 70–99)
Potassium: 4.7 mmol/L (ref 3.5–5.2)
Sodium: 138 mmol/L (ref 134–144)
Total Protein: 7.3 g/dL (ref 6.0–8.5)
eGFR: 54 mL/min/{1.73_m2} — ABNORMAL LOW (ref >59)

## 2022-02-14 LAB — LIPID PANEL
Chol/HDL Ratio: 5.5 ratio — ABNORMAL HIGH (ref 0.0–5.0)
Cholesterol, Total: 236 mg/dL — ABNORMAL HIGH (ref 100–199)
HDL: 43 mg/dL (ref >39)
LDL Chol Calc (NIH): 171 mg/dL — ABNORMAL HIGH (ref 0–99)
Triglycerides: 121 mg/dL (ref 0–149)
VLDL Cholesterol Cal: 22 mg/dL (ref 5–40)

## 2022-02-14 NOTE — Progress Notes (Signed)
Please let patient know that his cholesterol is elevated.  His cardiac risk score puts him at high risk of having a stroke or heart attack over the next 10 years.  I recommend that he start a statin called crestor '5mg'$  daily.  The goal will be to increase this to '20mg'$  daily if patient tolerates it well.  If he agrees to the medication I can send it to the pharmacy.   ? ?His other lab work shows that his kidneys are not functioning as well as they should be.  There is not medication to help this we will just monitor.  I do recommend he avoid motrin, ibuprofen and aleve.   ? ?His sugar is also slightly elevated.  We will check him for diabetes at our next visit.  Please let me know if he has any questions. ? ?The 10-year ASCVD risk score (Arnett DK, et al., 2019) is: 23.5% ?  Values used to calculate the score: ?    Age: 71 years ?    Sex: Male ?    Is Non-Hispanic African American: No ?    Diabetic: No ?    Tobacco smoker: Yes ?    Systolic Blood Pressure: 341 mmHg ?    Is BP treated: No ?    HDL Cholesterol: 43 mg/dL ?    Total Cholesterol: 236 mg/dL ?

## 2022-02-21 ENCOUNTER — Telehealth: Payer: Self-pay | Admitting: Nurse Practitioner

## 2022-02-21 NOTE — Chronic Care Management (AMB) (Signed)
  Care Management   Outreach Note  02/21/2022 Name: Terry French MRN: [id number] DOB: [birth year]  Referred by: Jon Billings, NP Reason for referral : Chronic Care Management (Outreach to schedule referral with Pharm D )   A second unsuccessful telephone outreach was attempted today. The patient was referred to the case management team for assistance with care management and care coordination.   Follow Up Plan:  A HIPAA compliant phone message was left for the patient providing contact information and requesting a return call.  The care management team will reach out to the patient again over the next 7 days.  If patient returns call to provider office, please advise to call [facility] * at [phone]*  Noreene Larsson, [place], [place] [facility]  [place], [place] [address] Direct Dial: [phone] [email or web address]: [email or web address]

## 2022-02-21 NOTE — Telephone Encounter (Signed)
N/A unable to leave a message for patient to call back and schedule Medicare Annual Wellness Visit (AWV) to be done virtually or by telephone. ? ?No hx of AWV eligible as of 07/10/15 ? ?Please schedule at anytime with [facility] Health Advisor.     ? ?45 Minutes appointment  ? ?Any questions, please call me at [phone]  ?

## 2022-02-28 NOTE — Chronic Care Management (AMB) (Signed)
  Care Management   Outreach Note  02/28/2022 Name: Terry French MRN: [id number] DOB: [birth year]  Referred by: Jon Billings, NP Reason for referral : Chronic Care Management (Outreach to schedule referral with Pharm D )   Third unsuccessful telephone outreach was attempted today. The patient was referred to the case management team for assistance with care management and care coordination. The patient's primary care provider has been notified of our unsuccessful attempts to make or maintain contact with the patient. The care management team is pleased to engage with this patient at any time in the future should he/she be interested in assistance from the care management team.   Follow Up Plan:  We have been unable to make contact with the patient for follow up. The care management team is available to follow up with the patient after provider conversation with the patient regarding recommendation for care management engagement and subsequent re-referral to the care management team.   Noreene Larsson, [place], [place], [place] [address] Direct Dial: [phone] [email or web address]

## 2022-03-14 ENCOUNTER — Ambulatory Visit: Payer: Medicare Other | Admitting: Nurse Practitioner

## 2022-03-14 NOTE — Progress Notes (Deleted)
There were no vitals taken for this visit.   Subjective:    Patient ID: Terry French, male    DOB: Aug 04, 1951, 71 y.o.   MRN: [id number]  HPI: Terry French is a 71 y.o. male  No chief complaint on file.  COPD COPD status: uncontrolled Satisfied with current treatment?: no Oxygen use: no Dyspnea frequency: all the time Cough frequency: daily Rescue inhaler frequency:  not using because he's been out  Limitation of activity: no Productive cough: yes Last Spirometry:  Pneumovax: Not up to Date Influenza: Not up to Date    Relevant past medical, surgical, family and social history reviewed and updated as indicated. Interim medical history since our last visit reviewed. Allergies and medications reviewed and updated.  Review of Systems  Respiratory:  Positive for cough and shortness of breath.    Per HPI unless specifically indicated above     Objective:    There were no vitals taken for this visit.  Wt Readings from Last 3 Encounters:  02/13/22 160 lb 6.4 oz (72.8 kg)  01/27/21 154 lb 4 oz (70 kg)  01/14/21 160 lb (72.6 kg)    Physical Exam Vitals and nursing note reviewed.  Constitutional:      General: He is not in acute distress.    Appearance: Normal appearance. He is not ill-appearing, toxic-appearing or diaphoretic.  HENT:     Head: Normocephalic.     Right Ear: External ear normal.     Left Ear: External ear normal.     Nose: Nose normal. No congestion or rhinorrhea.     Mouth/Throat:     Mouth: Mucous membranes are moist.  Eyes:     General:        Right eye: No discharge.        Left eye: No discharge.     Extraocular Movements: Extraocular movements intact.     Conjunctiva/sclera: Conjunctivae normal.     Pupils: Pupils are equal, round, and reactive to light.  Cardiovascular:     Rate and Rhythm: Normal rate and regular rhythm.     Heart sounds: No murmur heard. Pulmonary:     Effort: Pulmonary effort is normal. No respiratory distress.      Breath sounds: Normal breath sounds. No wheezing, rhonchi or rales.  Abdominal:     General: Abdomen is flat. Bowel sounds are normal.  Musculoskeletal:     Cervical back: Normal range of motion and neck supple.  Skin:    General: Skin is warm and dry.     Capillary Refill: Capillary refill takes less than 2 seconds.  Neurological:     General: No focal deficit present.     Mental Status: He is alert and oriented to person, place, and time.  Psychiatric:        Mood and Affect: Mood normal.        Behavior: Behavior normal.        Thought Content: Thought content normal.        Judgment: Judgment normal.    Results for orders placed or performed in visit on 02/13/22  Comp Met (CMET)  Result Value Ref Range   Glucose 100 (H) 70 - 99 mg/dL   BUN 19 8 - 27 mg/dL   Creatinine, Ser 1.42 (H) 0.76 - 1.27 mg/dL   eGFR 54 (L) >59 mL/min/1.73   BUN/Creatinine Ratio 13 10 - 24   Sodium 138 134 - 144 mmol/L   Potassium 4.7 3.5 - 5.2  mmol/L   Chloride 102 96 - 106 mmol/L   CO2 21 20 - 29 mmol/L   Calcium 9.5 8.6 - 10.2 mg/dL   Total Protein 7.3 6.0 - 8.5 g/dL   Albumin 4.4 3.8 - 4.8 g/dL   Globulin, Total 2.9 1.5 - 4.5 g/dL   Albumin/Globulin Ratio 1.5 1.2 - 2.2   Bilirubin Total 0.4 0.0 - 1.2 mg/dL   Alkaline Phosphatase 72 44 - 121 IU/L   AST 18 0 - 40 IU/L   ALT 20 0 - 44 IU/L  Lipid Profile  Result Value Ref Range   Cholesterol, Total 236 (H) 100 - 199 mg/dL   Triglycerides 121 0 - 149 mg/dL   HDL 43 >39 mg/dL   VLDL Cholesterol Cal 22 5 - 40 mg/dL   LDL Chol Calc (NIH) 171 (H) 0 - 99 mg/dL   Chol/HDL Ratio 5.5 (H) 0.0 - 5.0 ratio      Assessment & Plan:   Problem List Items Addressed This Visit      Respiratory   Emphysema lung ([place]) - Primary     Follow up plan: No follow-ups on file.

## 2022-03-17 ENCOUNTER — Ambulatory Visit: Payer: Medicare Other | Admitting: Nurse Practitioner

## 2022-04-05 ENCOUNTER — Telehealth: Payer: Self-pay

## 2022-04-05 ENCOUNTER — Ambulatory Visit (INDEPENDENT_AMBULATORY_CARE_PROVIDER_SITE_OTHER): Payer: Medicare Other | Admitting: Nurse Practitioner

## 2022-04-05 ENCOUNTER — Encounter: Payer: Self-pay | Admitting: Nurse Practitioner

## 2022-04-05 VITALS — BP 113/72 | HR 85 | Temp 98.0°F | Wt 161.8 lb

## 2022-04-05 DIAGNOSIS — Z23 Encounter for immunization: Secondary | ICD-10-CM

## 2022-04-05 DIAGNOSIS — J439 Emphysema, unspecified: Secondary | ICD-10-CM | POA: Diagnosis not present

## 2022-04-05 DIAGNOSIS — I700 Atherosclerosis of aorta: Secondary | ICD-10-CM | POA: Diagnosis not present

## 2022-04-05 DIAGNOSIS — N1831 Chronic kidney disease, stage 3a: Secondary | ICD-10-CM

## 2022-04-05 DIAGNOSIS — N183 Chronic kidney disease, stage 3 unspecified: Secondary | ICD-10-CM | POA: Insufficient documentation

## 2022-04-05 DIAGNOSIS — R7301 Impaired fasting glucose: Secondary | ICD-10-CM | POA: Diagnosis not present

## 2022-04-05 MED ORDER — ROSUVASTATIN CALCIUM 5 MG PO TABS: 5.0000 mg | ORAL_TABLET | Freq: Every day | ORAL | 1 refills | Status: DC

## 2022-04-05 NOTE — Assessment & Plan Note (Signed)
Chronic. Labs ordered today. Discussed avoids NSAIDS. Will make further recommendations based on lab results.

## 2022-04-05 NOTE — Telephone Encounter (Signed)
Copied from [place]. Topic: General - Other >> Apr 05, 2022  4:39 PM Rudene Anda wrote: Reason for CRM: Caller stated the pt was just seen today 6/28 and can't afford Budeson-Glycopyrrol-Formoterol (BREZTRI AEROSPHERE), caller wanted to know if there was a different option or discount program the pt could use.

## 2022-04-05 NOTE — Progress Notes (Signed)
BP 113/72   Pulse 85   Temp 98 F (36.7 C) (Oral)   Wt 161 lb 12.8 oz (73.4 kg)   SpO2 97%   BMI 26.54 kg/m    Subjective:    Patient ID: Terry French, male    DOB: 03-28-1951, 71 y.o.   MRN: [id number]  HPI: GARV KUECHLE is a 71 y.o. male  Chief Complaint  Patient presents with   COPD    Pt states his brother was recent;y dx with diabetes. Would like A1C checked    COPD COPD status: improved Satisfied with current treatment?: yes Oxygen use: no Dyspnea frequency: daily Cough frequency: daily Rescue inhaler frequency: a couple times a week Limitation of activity: no Productive cough: yes Last Spirometry:  Pneumovax: up to date Influenza: Not up to Date  CHRONIC KIDNEY DISEASE CKD status: controlled Medications renally dose: yes Previous renal evaluation: no Pneumovax:  Up to Date Influenza Vaccine:  Not up to Date   Relevant past medical, surgical, family and social history reviewed and updated as indicated. Interim medical history since our last visit reviewed. Allergies and medications reviewed and updated.  Review of Systems  Respiratory:  Positive for cough and shortness of breath. Negative for wheezing.     Per HPI unless specifically indicated above     Objective:    BP 113/72   Pulse 85   Temp 98 F (36.7 C) (Oral)   Wt 161 lb 12.8 oz (73.4 kg)   SpO2 97%   BMI 26.54 kg/m   Wt Readings from Last 3 Encounters:  04/05/22 161 lb 12.8 oz (73.4 kg)  02/13/22 160 lb 6.4 oz (72.8 kg)  01/27/21 154 lb 4 oz (70 kg)    Physical Exam Vitals and nursing note reviewed.  Constitutional:      General: He is not in acute distress.    Appearance: Normal appearance. He is not ill-appearing, toxic-appearing or diaphoretic.  HENT:     Head: Normocephalic.     Right Ear: External ear normal.     Left Ear: External ear normal.     Nose: Nose normal. No congestion or rhinorrhea.     Mouth/Throat:     Mouth: Mucous membranes are moist.  Eyes:      General:        Right eye: No discharge.        Left eye: No discharge.     Extraocular Movements: Extraocular movements intact.     Conjunctiva/sclera: Conjunctivae normal.     Pupils: Pupils are equal, round, and reactive to light.  Cardiovascular:     Rate and Rhythm: Normal rate and regular rhythm.     Heart sounds: No murmur heard. Pulmonary:     Effort: Pulmonary effort is normal. No respiratory distress.     Breath sounds: Wheezing present. No rhonchi or rales.  Abdominal:     General: Abdomen is flat. Bowel sounds are normal.  Musculoskeletal:     Cervical back: Normal range of motion and neck supple.  Skin:    General: Skin is warm and dry.     Capillary Refill: Capillary refill takes less than 2 seconds.  Neurological:     General: No focal deficit present.     Mental Status: He is alert and oriented to person, place, and time.  Psychiatric:        Mood and Affect: Mood normal.        Behavior: Behavior normal.  Thought Content: Thought content normal.        Judgment: Judgment normal.     Results for orders placed or performed in visit on 02/13/22  Comp Met (CMET)  Result Value Ref Range   Glucose 100 (H) 70 - 99 mg/dL   BUN 19 8 - 27 mg/dL   Creatinine, Ser 1.42 (H) 0.76 - 1.27 mg/dL   eGFR 54 (L) >59 mL/min/1.73   BUN/Creatinine Ratio 13 10 - 24   Sodium 138 134 - 144 mmol/L   Potassium 4.7 3.5 - 5.2 mmol/L   Chloride 102 96 - 106 mmol/L   CO2 21 20 - 29 mmol/L   Calcium 9.5 8.6 - 10.2 mg/dL   Total Protein 7.3 6.0 - 8.5 g/dL   Albumin 4.4 3.8 - 4.8 g/dL   Globulin, Total 2.9 1.5 - 4.5 g/dL   Albumin/Globulin Ratio 1.5 1.2 - 2.2   Bilirubin Total 0.4 0.0 - 1.2 mg/dL   Alkaline Phosphatase 72 44 - 121 IU/L   AST 18 0 - 40 IU/L   ALT 20 0 - 44 IU/L  Lipid Profile  Result Value Ref Range   Cholesterol, Total 236 (H) 100 - 199 mg/dL   Triglycerides 121 0 - 149 mg/dL   HDL 43 >39 mg/dL   VLDL Cholesterol Cal 22 5 - 40 mg/dL   LDL Chol Calc (NIH)  171 (H) 0 - 99 mg/dL   Chol/HDL Ratio 5.5 (H) 0.0 - 5.0 ratio      Assessment & Plan:   Problem List Items Addressed This Visit       Cardiovascular and Mediastinum   Atherosclerosis of aorta ([facility])    Chronic. Will start Crestor 5mg daily.  Goal is to increase to 20mg daily as patient tolerates medications.       Relevant Medications   rosuvastatin (CRESTOR) 5 MG tablet     Respiratory   Emphysema lung ([facility]) - Primary    Chronic. Improved with Breztri.  Discussed continuing with medication.  Using Albuterol a couple times weekly.  Discussed decreasing cigarette consumption from 1.5ppd to 1 ppd.  Follow up in 3 months for reevaluation.  Prevnar 20 given in office today.          Genitourinary   CKD (chronic kidney disease) stage 3, GFR 30-59 ml/min ([facility])    Chronic. Labs ordered today. Discussed avoids NSAIDS. Will make further recommendations based on lab results.       Relevant Orders   Comp Met (CMET)   Other Visit Diagnoses     Need for pneumococcal 20-valent conjugate vaccination       Relevant Orders   Pneumococcal conjugate vaccine 20-valent (Prevnar 20)   Elevated fasting glucose       Labs ordered today. Will make recommendations based on lab results.   Relevant Orders   HgB A1c        Follow up plan: Return in about 3 months (around 07/06/2022) for HTN, HLD, DM2 FU.      

## 2022-04-05 NOTE — Assessment & Plan Note (Signed)
Chronic. Will start Crestor '5mg'$  daily.  Goal is to increase to '20mg'$  daily as patient tolerates medications.

## 2022-04-05 NOTE — Assessment & Plan Note (Signed)
Chronic. Improved with Breztri.  Discussed continuing with medication.  Using Albuterol a couple times weekly.  Discussed decreasing cigarette consumption from 1.5ppd to 1 ppd.  Follow up in 3 months for reevaluation.  Prevnar 20 given in office today.

## 2022-04-06 LAB — COMPREHENSIVE METABOLIC PANEL
ALT: 21 IU/L (ref 0–44)
AST: 19 IU/L (ref 0–40)
Albumin/Globulin Ratio: 1.5 (ref 1.2–2.2)
Albumin: 4.3 g/dL (ref 3.8–4.8)
Alkaline Phosphatase: 72 IU/L (ref 44–121)
BUN/Creatinine Ratio: 13 (ref 10–24)
BUN: 18 mg/dL (ref 8–27)
Bilirubin Total: 0.3 mg/dL (ref 0.0–1.2)
CO2: 21 mmol/L (ref 20–29)
Calcium: 9.5 mg/dL (ref 8.6–10.2)
Chloride: 102 mmol/L (ref 96–106)
Creatinine, Ser: 1.44 mg/dL — ABNORMAL HIGH (ref 0.76–1.27)
Globulin, Total: 2.8 g/dL (ref 1.5–4.5)
Glucose: 96 mg/dL (ref 70–99)
Potassium: 4.8 mmol/L (ref 3.5–5.2)
Sodium: 139 mmol/L (ref 134–144)
Total Protein: 7.1 g/dL (ref 6.0–8.5)
eGFR: 53 mL/min/{1.73_m2} — ABNORMAL LOW (ref >59)

## 2022-04-06 LAB — HEMOGLOBIN A1C
Est. average glucose Bld gHb Est-mCnc: 137 mg/dL
Hgb A1c MFr Bld: 6.4 % — ABNORMAL HIGH (ref 4.8–5.6)

## 2022-04-06 NOTE — Telephone Encounter (Signed)
Can we please call patient's insurance company and find out what is covered for COPD inhalers?

## 2022-04-06 NOTE — Progress Notes (Signed)
Please let patient know that his lab work shows that he is prediabetic.  No medications at this time.  I do recommend he start the cholesterol medication that we discussed during his visit.  His kidney function remains stable.  I will see him at our follow up.

## 2022-04-12 ENCOUNTER — Ambulatory Visit: Payer: Medicare Other

## 2022-04-13 MED ORDER — BUDESONIDE-FORMOTEROL FUMARATE 160-4.5 MCG/ACT IN AERO: 2.0000 | INHALATION_SPRAY | Freq: Two times a day (BID) | RESPIRATORY_TRACT | 3 refills | Status: DC

## 2022-04-13 NOTE — Telephone Encounter (Signed)
Medication changed to symbicort.

## 2022-04-25 ENCOUNTER — Ambulatory Visit (INDEPENDENT_AMBULATORY_CARE_PROVIDER_SITE_OTHER): Payer: Medicare Other | Admitting: *Deleted

## 2022-04-25 DIAGNOSIS — Z1211 Encounter for screening for malignant neoplasm of colon: Secondary | ICD-10-CM | POA: Diagnosis not present

## 2022-04-25 DIAGNOSIS — Z0000 Encounter for general adult medical examination without abnormal findings: Secondary | ICD-10-CM | POA: Diagnosis not present

## 2022-04-25 NOTE — Progress Notes (Signed)
Subjective:   Terry French is a 71 y.o. male who presents for an Initial Medicare Annual Wellness Visit.  I connected with  Terry French on 04/25/22 by a telphone enabled telemedicine application and verified that I am speaking with the correct person using two identifiers.   I discussed the limitations of evaluation and management by telemedicine. The patient expressed understanding and agreed to proceed.  Patient location: home  Provider location: Tele-health-home    Review of Systems     Cardiac Risk Factors include: advanced age (>27mn, >>49women);male gender     Objective:    Today's Vitals   There is no height or weight on file to calculate BMI.     04/25/2022   12:23 PM 01/14/2021   11:27 AM  Advanced Directives  Does Patient Have a Medical Advance Directive? No No  Would patient like information on creating a medical advance directive? No - Patient declined No - Patient declined    Current Medications (verified) Outpatient Encounter Medications as of 04/25/2022  Medication Sig   albuterol (VENTOLIN HFA) 108 (90 Base) MCG/ACT inhaler Inhale 1-2 puffs into the lungs every 6 (six) hours as needed for wheezing.   budesonide-formoterol (SYMBICORT) 160-4.5 MCG/ACT inhaler Inhale 2 puffs into the lungs 2 (two) times daily.   rosuvastatin (CRESTOR) 5 MG tablet Take 1 tablet (5 mg total) by mouth daily.   No facility-administered encounter medications on file as of 04/25/2022.    Allergies (verified) Patient has no known allergies.   History: Past Medical History:  Diagnosis Date   Asthma    COPD [place]ch[place] obstructive [place] disease) ([place]    Emphysema lung (HCC)    Past Surgical History:  Procedure Laterality Date   BACK SURGERY     Family History  Problem Relation Age of Onset   Cancer Mother    COPD Father    Heart attack Brother    Diabetes Brother    Social History   Socioeconomic History   Marital status: Divorced    Spouse name: Not on file    Number of children: Not on file   Years of education: Not on file   Highest education level: Not on file  Occupational History   Not on file  Tobacco Use   Smoking status: Every Day    Packs/day: 1.00    Years: 44.00    Total pack years: 44.00    Types: Cigarettes   Smokeless tobacco: Never  Vaping Use   Vaping Use: Never used  Substance and Sexual Activity   Alcohol use: No    Comment: former alcoholic   Drug use: Yes    Types: Marijuana    Comment: cocaine use in the past   Sexual activity: Yes    Birth control/protection: None, Condom  Other Topics Concern   Not on file  Social History Narrative   Not on file   Social Determinants of Health   Financial Resource Strain: Low Risk  (04/25/2022)   Overall Financial Resource Strain (CARDIA)    Difficulty of Paying Living Expenses: Not hard at all  Food Insecurity: No Food Insecurity (04/25/2022)   Hunger Vital Sign    Worried About Running Out of Food in the Last Year: Never true    Ran Out of Food in the Last Year: Never true  Transportation Needs: No Transportation Needs (04/25/2022)   PRAPARE - THydrologist(Medical): No    Lack of Transportation (Non-Medical): No  Physical Activity: Not on file  Stress: No Stress Concern Present (04/25/2022)   [place]    Feeling of Stress : Only a little  Social Connections: Socially Isolated (04/25/2022)   Social Connection and Isolation Panel [NHANES]    Frequency of Communication with Friends and Family: Never    Frequency of Social Gatherings with Friends and Family: Once a week    Attends Religious Services: Never    Marine scientist or Organizations: No    Attends Music therapist: Never    Marital Status: Divorced    Tobacco Counseling Ready to quit: Not Answered Counseling given: Not Answered   Clinical Intake:  Pre-visit preparation completed:  Yes  Pain : No/denies pain     Nutritional Risks: None Diabetes: No  How often do you need to have someone help you when you read instructions, pamphlets, or other written materials from your doctor or pharmacy?: 5 - Always  Diabetic?  no  Interpreter Needed?: No  Information entered by :: Leroy Kennedy LPN   Activities of Daily Living    04/25/2022   12:46 PM  In your present state of health, do you have any difficulty performing the following activities:  Hearing? 0  Vision? 0  Difficulty concentrating or making decisions? 0  Walking or climbing stairs? 1  Dressing or bathing? 0  Doing errands, shopping? 1  Preparing Food and eating ? Y  Using the Toilet? N  In the past six months, have you accidently leaked urine? N  Do you have problems with loss of bowel control? N  Managing your Medications? Y  Managing your Finances? Y  Housekeeping or managing your Housekeeping? Y    Patient Care Team: Jon Billings, NP as PCP - General (Nurse Practitioner)  Indicate any recent Medical Services you may have received from other than [facility] providers in the past year (date may be approximate).     Assessment:   This is a routine wellness examination for [place].  Hearing/Vision screen Hearing Screening - Comments:: No trouble hearing Vision Screening - Comments:: Not up date No eye doctor  Dietary issues and exercise activities discussed: Current Exercise Habits: Home exercise routine, Type of exercise: walking, Time (Minutes): 30, Frequency (Times/Week): 4, Weekly Exercise (Minutes/Week): 120, Intensity: Mild   Goals Addressed             This Visit's Progress    Patient Stated       No goals       Depression Screen    04/25/2022   12:30 PM 04/05/2022    1:54 PM 02/13/2022   10:15 AM 01/27/2021    1:17 PM  PHQ 2/9 Scores  PHQ - 2 Score 0 '3 4 3  '$ PHQ- 9 Score '2 8 16 6    '$ Fall Risk    04/25/2022   12:20 PM 04/05/2022    1:54 PM 02/13/2022   10:15 AM 01/27/2021     1:17 PM  Fall Risk   Falls in the past year? 0 1 1 0  Number falls in past yr: 0 1 1 0  Injury with Fall? 0 0 0 0  Risk for fall due to :  History of fall(s) No Fall Risks   Follow up Falls evaluation completed;Education provided;Falls prevention discussed Falls evaluation completed Falls evaluation completed     FALL RISK PREVENTION PERTAINING TO THE HOME:  Any stairs in or around the home? Yes  If  so, are there any without handrails? No  Home free of loose throw rugs in walkways, pet beds, electrical cords, etc? Yes  Adequate lighting in your home to reduce risk of falls? Yes   ASSISTIVE DEVICES UTILIZED TO PREVENT FALLS:  Life alert? No  Use of a cane, walker or w/c? No  Grab bars in the bathroom? No  Shower chair or bench in shower? No  Elevated toilet seat or a handicapped toilet? No   TIMED UP AND GO:  Was the test performed? No .    Cognitive Function:        04/25/2022   12:21 PM  6CIT Screen  What Year? 4 points  What month? 0 points  What time? 0 points  Count back from 20 4 points  Months in reverse 4 points  Repeat phrase 2 points  Total Score 14 points    Immunizations Immunization History  Administered Date(s) Administered   PNEUMOCOCCAL CONJUGATE-20 04/05/2022    TDAP status: Due, Education has been provided regarding the importance of this vaccine. Advised may receive this vaccine at local pharmacy or Health Dept. Aware to provide a copy of the vaccination record if obtained from local pharmacy or Health Dept. Verbalized acceptance and understanding.  Flu Vaccine status: Due, Education has been provided regarding the importance of this vaccine. Advised may receive this vaccine at local pharmacy or Health Dept. Aware to provide a copy of the vaccination record if obtained from local pharmacy or Health Dept. Verbalized acceptance and understanding.  Pneumococcal vaccine status: Up to date  Covid-19 vaccine status: Information provided on how  to obtain vaccines.   Qualifies for Shingles Vaccine? Yes   Zostavax completed No   Shingrix Completed?: No.    Education has been provided regarding the importance of this vaccine. Patient has been advised to call insurance company to determine out of pocket expense if they have not yet received this vaccine. Advised may also receive vaccine at local pharmacy or Health Dept. Verbalized acceptance and understanding.  Screening Tests Health Maintenance  Topic Date Due   Hepatitis C Screening  Never done   COLONOSCOPY (Pts 45-6yr Insurance coverage will need to be confirmed)  Never done   COVID-19 Vaccine (1) 05/11/2022 (Originally 11/18/1951)   Zoster Vaccines- Shingrix (1 of 2) 07/26/2022 (Originally 05/19/2003)   TETANUS/TDAP  04/26/2023 (Originally [birth year]1972)   INFLUENZA VACCINE  05/09/2022   Pneumonia Vaccine 71 Years old  Completed   HPV VACCINES  Aged Out    Health Maintenance  Health Maintenance Due  Topic Date Due   Hepatitis C Screening  Never done   COLONOSCOPY (Pts 45-464yrInsurance coverage will need to be confirmed)  Never done    Colorectal cancer screening: Referral to GI placed  . Pt aware the office will call re: appt.  Lung Cancer Screening: (Low Dose CT Chest recommended if Age 860-80ears, 30 pack-year currently smoking OR have quit w/in 15years.) does not qualify.   Lung Cancer Screening Referral:   Additional Screening:  Hepatitis C Screening: does qualify;  Vision Screening: Recommended annual ophthalmology exams for early detection of glaucoma and other disorders of the eye. Is the patient up to date with their annual eye exam?  No  Who is the provider or what is the name of the office in which the patient attends annual eye exams?  If pt is not established with a provider, would they like to be referred to a provider to establish care? No .  Dental Screening: Recommended annual dental exams for proper oral hygiene  Community Resource Referral /  Chronic Care Management: CRR required this visit?  No   CCM required this visit?  No      Plan:     I have personally reviewed and noted the following in the patient's chart:   Medical and social history Use of alcohol, tobacco or illicit drugs  Current medications and supplements including opioid prescriptions. Patient is not currently taking opioid prescriptions. Functional ability and status Nutritional status Physical activity Advanced directives List of other physicians Hospitalizations, surgeries, and ER visits in previous 12 months Vitals Screenings to include cognitive, depression, and falls Referrals and appointments  In addition, I have reviewed and discussed with patient certain preventive protocols, quality metrics, and best practice recommendations. A written personalized care plan for preventive services as well as general preventive health recommendations were provided to patient.     Leroy Kennedy, LPN   02/23/9841   Nurse Notes:

## 2022-04-25 NOTE — Patient Instructions (Signed)
Terry French , Thank you for taking time to come for your Medicare Wellness Visit. I appreciate your ongoing commitment to your health goals. Please review the following plan we discussed and let me know if I can assist you in the future.   Screening recommendations/referrals: Colonoscopy: Education provided Recommended yearly ophthalmology/optometry visit for glaucoma screening and checkup Recommended yearly dental visit for hygiene and checkup  Vaccinations: Influenza vaccine: Education provided Pneumococcal vaccine: up to date Tdap vaccine: Education provided Shingles vaccine: Education provided    Advanced directives: Education provided  Conditions/risks identified:   Next appointment: 07-06-2022 @ 1:20  [facility] 43 Years and Older, Male Preventive care refers to lifestyle choices and visits with your health care provider that can promote health and wellness. What does preventive care include? A yearly physical exam. This is also called an annual well check. Dental exams once or twice a year. Routine eye exams. Ask your health care provider how often you should have your eyes checked. Personal lifestyle choices, including: Daily care of your teeth and gums. Regular physical activity. Eating a healthy diet. Avoiding tobacco and drug use. Limiting alcohol use. Practicing safe sex. Taking low doses of aspirin every day. Taking vitamin and mineral supplements as recommended by your health care provider. What happens during an annual well check? The services and screenings done by your health care provider during your annual well check will depend on your age, overall health, lifestyle risk factors, and family history of disease. Counseling  Your health care provider may ask you questions about your: Alcohol use. Tobacco use. Drug use. Emotional well-being. Home and relationship well-being. Sexual activity. Eating habits. History of falls. Memory and  ability to understand (cognition). Work and work Statistician. Screening  You may have the following tests or measurements: Height, weight, and BMI. Blood pressure. Lipid and cholesterol levels. These may be checked every 5 years, or more frequently if you are over 17 years old. Skin check. Lung cancer screening. You may have this screening every year starting at age 66 if you have a 30-pack-year history of smoking and currently smoke or have quit within the past 15 years. Fecal occult blood test (FOBT) of the stool. You may have this test every year starting at age 67. Flexible sigmoidoscopy or colonoscopy. You may have a sigmoidoscopy every 5 years or a colonoscopy every 10 years starting at age 13. Prostate cancer screening. Recommendations will vary depending on your family history and other risks. Hepatitis C blood test. Hepatitis B blood test. Sexually transmitted disease (STD) testing. Diabetes screening. This is done by checking your blood sugar (glucose) after you have not eaten for a while (fasting). You may have this done every 1-3 years. Abdominal aortic aneurysm (AAA) screening. You may need this if you are a current or former smoker. Osteoporosis. You may be screened starting at age 79 if you are at high risk. Talk with your health care provider about your test results, treatment options, and if necessary, the need for more tests. Vaccines  Your health care provider may recommend certain vaccines, such as: Influenza vaccine. This is recommended every year. Tetanus, diphtheria, and acellular pertussis (Tdap, Td) vaccine. You may need a Td booster every 10 years. Zoster vaccine. You may need this after age 6. Pneumococcal 13-valent conjugate (PCV13) vaccine. One dose is recommended after age 101. Pneumococcal polysaccharide (PPSV23) vaccine. One dose is recommended after age 22. Talk to your health care provider about which screenings and vaccines you need  and how often you need  them. This information is not intended to replace advice given to you by your health care provider. Make sure you discuss any questions you have with your health care provider. Document Released: 10/22/2015 Document Revised: 06/14/2016 Document Reviewed: 07/27/2015 Elsevier Interactive Patient Education  2017 [place] Prevention in the Home Falls can cause injuries. They can happen to people of all ages. There are many things you can do to make your home safe and to help prevent falls. What can I do on the outside of my home? Regularly fix the edges of walkways and driveways and fix any cracks. Remove anything that might make you trip as you walk through a door, such as a raised step or threshold. Trim any bushes or trees on the path to your home. Use bright outdoor lighting. Clear any walking paths of anything that might make someone trip, such as rocks or tools. Regularly check to see if handrails are loose or broken. Make sure that both sides of any steps have handrails. Any raised decks and porches should have guardrails on the edges. Have any leaves, snow, or ice cleared regularly. Use sand or salt on walking paths during winter. Clean up any spills in your garage right away. This includes oil or grease spills. What can I do in the bathroom? Use night lights. Install grab bars by the toilet and in the tub and shower. Do not use towel bars as grab bars. Use non-skid mats or decals in the tub or shower. If you need to sit down in the shower, use a plastic, non-slip stool. Keep the floor dry. Clean up any water that spills on the floor as soon as it happens. Remove soap buildup in the tub or shower regularly. Attach bath mats securely with double-sided non-slip rug tape. Do not have throw rugs and other things on the floor that can make you trip. What can I do in the bedroom? Use night lights. Make sure that you have a light by your bed that is easy to reach. Do not use  any sheets or blankets that are too big for your bed. They should not hang down onto the floor. Have a firm chair that has side arms. You can use this for support while you get dressed. Do not have throw rugs and other things on the floor that can make you trip. What can I do in the kitchen? Clean up any spills right away. Avoid walking on wet floors. Keep items that you use a lot in easy-to-reach places. If you need to reach something above you, use a strong step stool that has a grab bar. Keep electrical cords out of the way. Do not use floor polish or wax that makes floors slippery. If you must use wax, use non-skid floor wax. Do not have throw rugs and other things on the floor that can make you trip. What can I do with my stairs? Do not leave any items on the stairs. Make sure that there are handrails on both sides of the stairs and use them. Fix handrails that are broken or loose. Make sure that handrails are as long as the stairways. Check any carpeting to make sure that it is firmly attached to the stairs. Fix any carpet that is loose or worn. Avoid having throw rugs at the top or bottom of the stairs. If you do have throw rugs, attach them to the floor with carpet tape. Make sure that you  have a light switch at the top of the stairs and the bottom of the stairs. If you do not have them, ask someone to add them for you. What else can I do to help prevent falls? Wear shoes that: Do not have high heels. Have rubber bottoms. Are comfortable and fit you well. Are closed at the toe. Do not wear sandals. If you use a stepladder: Make sure that it is fully opened. Do not climb a closed stepladder. Make sure that both sides of the stepladder are locked into place. Ask someone to hold it for you, if possible. Clearly mark and make sure that you can see: Any grab bars or handrails. First and last steps. Where the edge of each step is. Use tools that help you move around (mobility aids)  if they are needed. These include: Canes. Walkers. Scooters. Crutches. Turn on the lights when you go into a dark area. Replace any light bulbs as soon as they burn out. Set up your furniture so you have a clear path. Avoid moving your furniture around. If any of your floors are uneven, fix them. If there are any pets around you, be aware of where they are. Review your medicines with your doctor. Some medicines can make you feel dizzy. This can increase your chance of falling. Ask your doctor what other things that you can do to help prevent falls. This information is not intended to replace advice given to you by your health care provider. Make sure you discuss any questions you have with your health care provider. Document Released: 07/22/2009 Document Revised: 03/02/2016 Document Reviewed: 10/30/2014 Elsevier Interactive Patient Education  2017 [employer].

## 2022-05-18 DIAGNOSIS — Z1211 Encounter for screening for malignant neoplasm of colon: Secondary | ICD-10-CM | POA: Diagnosis not present

## 2022-05-27 LAB — COLOGUARD: COLOGUARD: POSITIVE — AB

## 2022-05-29 ENCOUNTER — Other Ambulatory Visit: Payer: Self-pay | Admitting: Nurse Practitioner

## 2022-05-29 DIAGNOSIS — R195 Other fecal abnormalities: Secondary | ICD-10-CM

## 2022-05-29 NOTE — Progress Notes (Signed)
Spoke with spouse , verbalized understanding.

## 2022-05-29 NOTE — Progress Notes (Signed)
Please let patient know that his stool test was positive.  It is recommended that he have a colonoscopy done.  I have placed the referral for him to see GI.

## 2022-06-07 ENCOUNTER — Other Ambulatory Visit: Payer: Self-pay

## 2022-06-07 ENCOUNTER — Telehealth: Payer: Self-pay

## 2022-06-07 DIAGNOSIS — Z1211 Encounter for screening for malignant neoplasm of colon: Secondary | ICD-10-CM

## 2022-06-07 DIAGNOSIS — R195 Other fecal abnormalities: Secondary | ICD-10-CM

## 2022-06-07 MED ORDER — NA SULFATE-K SULFATE-MG SULF 17.5-3.13-1.6 GM/177ML PO SOLN: 1.0000 | Freq: Once | ORAL | 0 refills | Status: AC

## 2022-06-07 NOTE — Telephone Encounter (Signed)
Gastroenterology Pre-Procedure Review  Request Date: 07/05/22 Requesting Physician: Dr. Marius Ditch  PATIENT REVIEW QUESTIONS: The patient responded to the following health history questions as indicated:    1. Are you having any GI issues? no 2. Do you have a personal history of Polyps? no 3. Do you have a family history of Colon Cancer or Polyps? no 4. Diabetes Mellitus? no 5. Joint replacements in the past 12 months?no 6. Major health problems in the past 3 months?no 7. Any artificial heart valves, MVP, or defibrillator?no    MEDICATIONS & ALLERGIES:    Patient reports the following regarding taking any anticoagulation/antiplatelet therapy:   Plavix, Coumadin, Eliquis, Xarelto, Lovenox, Pradaxa, Brilinta, or Effient? no Aspirin? no  Patient confirms/reports the following medications:  Current Outpatient Medications  Medication Sig Dispense Refill   albuterol (VENTOLIN HFA) 108 (90 Base) MCG/ACT inhaler Inhale 1-2 puffs into the lungs every 6 (six) hours as needed for wheezing. 18 g 1   budesonide-formoterol (SYMBICORT) 160-4.5 MCG/ACT inhaler Inhale 2 puffs into the lungs 2 (two) times daily. 1 each 3   rosuvastatin (CRESTOR) 5 MG tablet Take 1 tablet (5 mg total) by mouth daily. 90 tablet 1   No current facility-administered medications for this visit.    Patient confirms/reports the following allergies:  No Known Allergies  No orders of the defined types were placed in this encounter.   AUTHORIZATION INFORMATION Primary Insurance: 1D#: Group #:  Secondary Insurance: 1D#: Group #:  SCHEDULE INFORMATION: Date: 07/05/22 Time: Location: [facility]

## 2022-07-04 ENCOUNTER — Encounter: Payer: Self-pay | Admitting: Gastroenterology

## 2022-07-05 ENCOUNTER — Ambulatory Visit: Payer: Medicare Other | Admitting: Anesthesiology

## 2022-07-05 ENCOUNTER — Other Ambulatory Visit: Payer: Self-pay

## 2022-07-05 ENCOUNTER — Encounter: Payer: Self-pay | Admitting: Gastroenterology

## 2022-07-05 ENCOUNTER — Encounter
Admission: RE | Disposition: A | Discharge status: Home / Self Care | Payer: Self-pay | Source: Home / Self Care | Attending: Gastroenterology

## 2022-07-05 ENCOUNTER — Ambulatory Visit
Admission: RE | Admit: 2022-07-05 | Discharge: 2022-07-05 | Disposition: A | Discharge status: Home / Self Care | Payer: Medicare Other | Attending: Gastroenterology | Admitting: Gastroenterology

## 2022-07-05 DIAGNOSIS — R195 Other fecal abnormalities: Secondary | ICD-10-CM | POA: Diagnosis not present

## 2022-07-05 DIAGNOSIS — I700 Atherosclerosis of aorta: Secondary | ICD-10-CM | POA: Diagnosis not present

## 2022-07-05 DIAGNOSIS — D122 Benign neoplasm of ascending colon: Secondary | ICD-10-CM | POA: Insufficient documentation

## 2022-07-05 DIAGNOSIS — D123 Benign neoplasm of transverse colon: Secondary | ICD-10-CM | POA: Diagnosis not present

## 2022-07-05 DIAGNOSIS — Z1211 Encounter for screening for malignant neoplasm of colon: Secondary | ICD-10-CM

## 2022-07-05 DIAGNOSIS — K635 Polyp of colon: Secondary | ICD-10-CM | POA: Diagnosis not present

## 2022-07-05 DIAGNOSIS — D126 Benign neoplasm of colon, unspecified: Secondary | ICD-10-CM | POA: Diagnosis not present

## 2022-07-05 DIAGNOSIS — D128 Benign neoplasm of rectum: Secondary | ICD-10-CM | POA: Diagnosis not present

## 2022-07-05 DIAGNOSIS — D120 Benign neoplasm of cecum: Secondary | ICD-10-CM | POA: Insufficient documentation

## 2022-07-05 DIAGNOSIS — F17210 Nicotine dependence, cigarettes, uncomplicated: Secondary | ICD-10-CM | POA: Diagnosis not present

## 2022-07-05 DIAGNOSIS — J439 Emphysema, unspecified: Secondary | ICD-10-CM | POA: Insufficient documentation

## 2022-07-05 HISTORY — PX: COLONOSCOPY WITH PROPOFOL: SHX5780

## 2022-07-05 SURGERY — COLONOSCOPY WITH PROPOFOL
Anesthesia: General

## 2022-07-05 MED ORDER — IPRATROPIUM-ALBUTEROL 0.5-2.5 (3) MG/3ML IN SOLN
RESPIRATORY_TRACT | Status: AC
  Filled 2022-07-05: qty 3

## 2022-07-05 MED ORDER — PROPOFOL 500 MG/50ML IV EMUL
INTRAVENOUS | Status: DC | PRN
  Administered 2022-07-05: 140 ug/kg/min via INTRAVENOUS

## 2022-07-05 MED ORDER — SODIUM CHLORIDE 0.9 % IV SOLN: INTRAVENOUS | Status: DC

## 2022-07-05 MED ORDER — IPRATROPIUM-ALBUTEROL 0.5-2.5 (3) MG/3ML IN SOLN
3.0000 mL | Freq: Once | RESPIRATORY_TRACT | Status: AC
  Administered 2022-07-05: 3 mL via RESPIRATORY_TRACT

## 2022-07-05 MED ORDER — PROPOFOL 10 MG/ML IV BOLUS
INTRAVENOUS | Status: DC | PRN
  Administered 2022-07-05: 70 mg via INTRAVENOUS

## 2022-07-05 NOTE — Progress Notes (Signed)
Nebulizer treatment administered per Md order.

## 2022-07-05 NOTE — Transfer of Care (Signed)
Immediate Anesthesia Transfer of Care Note  Patient: Terry French  Procedure(s) Performed: COLONOSCOPY WITH PROPOFOL  Patient Location: PACU  Anesthesia Type:General  Level of Consciousness: awake, alert  and oriented  Airway & Oxygen Therapy: Patient Spontanous Breathing  Post-op Assessment: Report given to RN and Post -op Vital signs reviewed and stable  Post vital signs: Reviewed and stable  Last Vitals:  Vitals Value Taken Time  BP 104/83 07/05/22 0902  Temp 36 C 07/05/22 0859  Pulse 73 07/05/22 0904  Resp 19 07/05/22 0904  SpO2 99 % 07/05/22 0904  Vitals shown include unvalidated device data.  Last Pain:  Vitals:   07/05/22 0859  TempSrc: Temporal  PainSc: Asleep         Complications: No notable events documented.

## 2022-07-05 NOTE — Anesthesia Postprocedure Evaluation (Signed)
Anesthesia Post Note  Patient: Terry French  Procedure(s) Performed: COLONOSCOPY WITH PROPOFOL  Patient location during evaluation: Endoscopy Anesthesia Type: General Level of consciousness: awake and alert Pain management: pain level controlled Vital Signs Assessment: post-procedure vital signs reviewed and stable Respiratory status: spontaneous breathing, nonlabored ventilation and respiratory function stable Cardiovascular status: blood pressure returned to baseline and stable Postop Assessment: no apparent nausea or vomiting Anesthetic complications: no   No notable events documented.   Last Vitals:  Vitals:   07/05/22 0939 07/05/22 0949  BP: 111/62 118/69  Pulse: 74 75  Resp:    Temp:    SpO2: 99% 99%    Last Pain:  Vitals:   07/05/22 0949  TempSrc:   PainSc: 0-No pain                 [place] Ouch

## 2022-07-05 NOTE — Op Note (Signed)
[facility] Patient Name: Terry French Procedure Date: 07/05/2022 8:24 AM MRN: [id number] Account #: [id number] Date of Birth: 05-17-1951 Admit Type: Outpatient Age: 71 Room: [facility] ENDO ROOM 4 Gender: Male Note Status: Finalized Instrument Name: Colonoscope [id number] Procedure:             Colonoscopy Indications:           This is the patient's first colonoscopy, Positive                         Cologuard test Providers:             Lin Landsman MD, MD Referring MD:          Jon Billings (Referring MD) Medicines:             General Anesthesia Complications:         No immediate complications. Estimated blood loss: None. Procedure:             Pre-Anesthesia Assessment:                        - Prior to the procedure, a History and Physical was                         performed, and patient medications and allergies were                         reviewed. The patient is competent. The risks and                         benefits of the procedure and the sedation options and                         risks were discussed with the patient. All questions                         were answered and informed consent was obtained.                         Patient identification and proposed procedure were                         verified by the physician, the nurse, the                         anesthesiologist, the anesthetist and the technician                         in the pre-procedure area in the procedure room in the                         endoscopy suite. Mental Status Examination: alert and                         oriented. Airway Examination: normal oropharyngeal                         airway and neck mobility. Respiratory Examination:  clear to auscultation. CV Examination: normal.                         Prophylactic Antibiotics: The patient does not require                         prophylactic antibiotics. Prior  Anticoagulants: The                         patient has taken no previous anticoagulant or                         antiplatelet agents. ASA Grade Assessment: III - A                         patient with severe systemic disease. After reviewing                         the risks and benefits, the patient was deemed in                         satisfactory condition to undergo the procedure. The                         anesthesia plan was to use general anesthesia.                         Immediately prior to administration of medications,                         the patient was re-assessed for adequacy to receive                         sedatives. The heart rate, respiratory rate, oxygen                         saturations, blood pressure, adequacy of pulmonary                         ventilation, and response to care were monitored                         throughout the procedure. The physical status of the                         patient was re-assessed after the procedure.                        After obtaining informed consent, the colonoscope was                         passed under direct vision. Throughout the procedure,                         the patient's blood pressure, pulse, and oxygen                         saturations were monitored continuously. The  Colonoscope was introduced through the anus and                         advanced to the the cecum, identified by appendiceal                         orifice and ileocecal valve. The colonoscopy was                         performed without difficulty. The patient tolerated                         the procedure well. The quality of the bowel                         preparation was fair. Findings:      The perianal and digital rectal examinations were normal. Pertinent       negatives include normal sphincter tone and no palpable rectal lesions.      Six sessile polyps were found in the rectum, transverse  colon, ascending       colon and cecum. The polyps were 3 to 6 mm in size. These polyps were       removed with a cold snare. Resection and retrieval were complete.      A diminutive polyp was found in the sigmoid colon. The polyp was       sessile. The polyp was removed with a jumbo cold forceps. Resection and       retrieval were complete. Estimated blood loss: none.      The retroflexed view of the distal rectum and anal verge was normal and       showed no anal or rectal abnormalities. Impression:            - Preparation of the colon was fair.                        - Six 3 to 6 mm polyps in the rectum, in the                         transverse colon, in the ascending colon and in the                         cecum, removed with a cold snare. Resected and                         retrieved.                        - One diminutive polyp in the sigmoid colon, removed                         with a jumbo cold forceps. Resected and retrieved.                        - The distal rectum and anal verge are normal on                         retroflexion view. Recommendation:        - Discharge patient  to home (with escort).                        - Resume previous diet today.                        - Continue present medications.                        - Await pathology results.                        - Repeat colonoscopy in 2 years because the bowel                         preparation was suboptimal and for surveillance of                         multiple polyps. Procedure Code(s):     --- Professional ---                        [phone], Colonoscopy, flexible; with removal of                         tumor(s), polyp(s), or other lesion(s) by snare                         technique                        45380, 59, Colonoscopy, flexible; with biopsy, single                         or multiple Diagnosis Code(s):     --- Professional ---                        K62.1, Rectal polyp                         R19.5, Other fecal abnormalities                        K63.5, Polyp of colon CPT copyright 2019 American Medical Association. All rights reserved. The codes documented in this report are preliminary and upon coder review may  be revised to meet current compliance requirements. Dr. Ulyess Mort Lin Landsman MD, MD 07/05/2022 8:58:53 AM This report has been signed electronically. Number of Addenda: 0 Note Initiated On: 07/05/2022 8:24 AM Scope Withdrawal Time: 0 hours 14 minutes 48 seconds  Total Procedure Duration: 0 hours 23 minutes 0 seconds  Estimated Blood Loss:  Estimated blood loss: none.      [facility]

## 2022-07-05 NOTE — Anesthesia Preprocedure Evaluation (Addendum)
Anesthesia Evaluation  Patient identified by MRN, date of birth, ID band Patient awake    Reviewed: Allergy & Precautions, NPO status , Patient's Chart, lab work & pertinent test results  Airway Mallampati: III  TM Distance: >3 FB Neck ROM: full    Dental  (+) Missing   Pulmonary shortness of breath and with exertion, COPD, Current SmokerPatient did not abstain from smoking.,    Pulmonary exam normal        Cardiovascular negative cardio ROS Normal cardiovascular exam     Neuro/Psych negative neurological ROS  negative psych ROS   GI/Hepatic negative GI ROS, (+)     substance abuse  marijuana use,   Endo/Other  negative endocrine ROS  Renal/GU Renal InsufficiencyRenal disease  negative genitourinary   Musculoskeletal   Abdominal Normal abdominal exam  (+)   Peds  Hematology negative hematology ROS (+)   Anesthesia Other Findings Past Medical History: No date: Asthma No date: COPD (chronic obstructive pulmonary disease) (HCC) No date: Emphysema lung (HCC)  Past Surgical History: No date: BACK SURGERY     Reproductive/Obstetrics negative OB ROS                            Anesthesia Physical Anesthesia Plan  ASA: 3  Anesthesia Plan: General   Post-op Pain Management:    Induction: Intravenous  PONV Risk Score and Plan: Propofol infusion and TIVA  Airway Management Planned: Natural Airway  Additional Equipment:   Intra-op Plan:   Post-operative Plan:   Informed Consent: I have reviewed the patients History and Physical, chart, labs and discussed the procedure including the risks, benefits and alternatives for the proposed anesthesia with the patient or authorized representative who has indicated his/her understanding and acceptance.     Dental Advisory Given  Plan Discussed with: Anesthesiologist, CRNA and Surgeon  Anesthesia Plan Comments:        Anesthesia  Quick Evaluation

## 2022-07-05 NOTE — H&P (Signed)
Cephas Darby, MD [address]  [place]  [place], [place] [address]  Main: [phone]  Fax: [phone] Pager: [phone]  Primary Care Physician:  Jon Billings, NP Primary Gastroenterologist:  Dr. Cephas Darby  Pre-Procedure History & Physical: HPI:  Terry French is a 71 y.o. male is here for an colonoscopy.   Past Medical History:  Diagnosis Date   Asthma    COPD (chronic obstructive pulmonary disease) ([place])    Emphysema lung ([place])     Past Surgical History:  Procedure Laterality Date   BACK SURGERY      Prior to Admission medications   Medication Sig Start Date End Date Taking? Authorizing Provider  albuterol (VENTOLIN HFA) 108 (90 Base) MCG/ACT inhaler Inhale 1-2 puffs into the lungs every 6 (six) hours as needed for wheezing. 02/13/22   Jon Billings, NP  budesonide-formoterol [facility]) 160-4.5 MCG/ACT inhaler Inhale 2 puffs into the lungs 2 (two) times daily. 04/13/22   Jon Billings, NP  rosuvastatin (CRESTOR) 5 MG tablet Take 1 tablet (5 mg total) by mouth daily. 04/05/22   Jon Billings, NP    Allergies as of 06/07/2022   (No Known Allergies)    Family History  Problem Relation Age of Onset   Cancer Mother    COPD Father    Heart attack Brother    Diabetes Brother     Social History   Socioeconomic History   Marital status: Divorced    Spouse name: Not on file   Number of children: Not on file   Years of education: Not on file   Highest education level: Not on file  Occupational History   Not on file  Tobacco Use   Smoking status: Every Day    Packs/day: 1.00    Years: 44.00    Total pack years: 44.00    Types: Cigarettes   Smokeless tobacco: Never  Vaping Use   Vaping Use: Never used  Substance and Sexual Activity   Alcohol use: No    Comment: former alcoholic   Drug use: Yes    Types: Marijuana    Comment: cocaine use in the past   Sexual activity: Yes    Birth control/protection: None, Condom  Other  Topics Concern   Not on file  Social History Narrative   Not on file   Social Determinants of Health   Financial Resource Strain: Low Risk  (04/25/2022)   Overall Financial Resource Strain (CARDIA)    Difficulty of Paying Living Expenses: Not hard at all  Food Insecurity: No Food Insecurity (04/25/2022)   Hunger Vital Sign    Worried About Running Out of Food in the Last Year: Never true    Ran Out of Food in the Last Year: Never true  Transportation Needs: No Transportation Needs (04/25/2022)   PRAPARE - Hydrologist (Medical): No    Lack of Transportation (Non-Medical): No  Physical Activity: Not on file  Stress: No Stress Concern Present (04/25/2022)   [place]    Feeling of Stress : Only a little  Social Connections: Socially Isolated (04/25/2022)   Social Connection and Isolation Panel [NHANES]    Frequency of Communication with Friends and Family: Never    Frequency of Social Gatherings with Friends and Family: Once a week    Attends Religious Services: Never    Marine scientist or Organizations: No    Attends Archivist Meetings:  Never    Marital Status: Divorced  Human resources officer Violence: Not At Risk (04/25/2022)   Humiliation, Afraid, Rape, and Kick questionnaire    Fear of Current or Ex-Partner: No    Emotionally Abused: No    Physically Abused: No    Sexually Abused: No    Review of Systems: See HPI, otherwise negative ROS  Physical Exam: There were no vitals taken for this visit. General:   Alert,  pleasant and cooperative in NAD Head:  Normocephalic and atraumatic. Neck:  Supple; no masses or thyromegaly. Lungs:  Clear throughout to auscultation.    Heart:  Regular rate and rhythm. Abdomen:  Soft, nontender and nondistended. Normal bowel sounds, without guarding, and without rebound.   Neurologic:  Alert and  oriented x4;  grossly normal  neurologically.  Impression/Plan: [name] is here for an colonoscopy to be performed for positive cologuard  Risks, benefits, limitations, and alternatives regarding  colonoscopy have been reviewed with the patient.  Questions have been answered.  All parties agreeable.   Sherri Sear, MD  07/05/2022, 7:56 AM

## 2022-07-06 ENCOUNTER — Encounter: Payer: Self-pay | Admitting: Gastroenterology

## 2022-07-06 ENCOUNTER — Ambulatory Visit (INDEPENDENT_AMBULATORY_CARE_PROVIDER_SITE_OTHER): Payer: Medicare Other | Admitting: Nurse Practitioner

## 2022-07-06 VITALS — BP 119/74 | HR 87 | Temp 97.5°F | Wt 159.2 lb

## 2022-07-06 DIAGNOSIS — Z23 Encounter for immunization: Secondary | ICD-10-CM

## 2022-07-06 DIAGNOSIS — N1831 Chronic kidney disease, stage 3a: Secondary | ICD-10-CM | POA: Diagnosis not present

## 2022-07-06 DIAGNOSIS — R7303 Prediabetes: Secondary | ICD-10-CM | POA: Diagnosis not present

## 2022-07-06 DIAGNOSIS — E1122 Type 2 diabetes mellitus with diabetic chronic kidney disease: Secondary | ICD-10-CM | POA: Insufficient documentation

## 2022-07-06 DIAGNOSIS — I700 Atherosclerosis of aorta: Secondary | ICD-10-CM | POA: Diagnosis not present

## 2022-07-06 DIAGNOSIS — J439 Emphysema, unspecified: Secondary | ICD-10-CM

## 2022-07-06 DIAGNOSIS — E119 Type 2 diabetes mellitus without complications: Secondary | ICD-10-CM | POA: Insufficient documentation

## 2022-07-06 DIAGNOSIS — Z1159 Encounter for screening for other viral diseases: Secondary | ICD-10-CM

## 2022-07-06 LAB — SURGICAL PATHOLOGY

## 2022-07-06 MED ORDER — BUDESONIDE-FORMOTEROL FUMARATE 160-4.5 MCG/ACT IN AERO: 2.0000 | INHALATION_SPRAY | Freq: Two times a day (BID) | RESPIRATORY_TRACT | 3 refills | Status: DC

## 2022-07-06 MED ORDER — ALBUTEROL SULFATE HFA 108 (90 BASE) MCG/ACT IN AERS: 1.0000 | INHALATION_SPRAY | Freq: Four times a day (QID) | RESPIRATORY_TRACT | 1 refills | Status: DC | PRN

## 2022-07-06 MED ORDER — ROSUVASTATIN CALCIUM 10 MG PO TABS: 10.0000 mg | ORAL_TABLET | Freq: Every day | ORAL | 1 refills | Status: DC

## 2022-07-06 NOTE — Assessment & Plan Note (Signed)
Chronic. Not well controlled.  Does not have Symbicort or Albuterol.  Agrees to pick them up from the pharmacy. Discussed how to properly use and precautions to take.  Follow up in 3 months.  Call sooner if concerns arise.

## 2022-07-06 NOTE — Assessment & Plan Note (Signed)
Chronic. Will increase Crestor to '10mg'$  daily.  Goal is to increase to '20mg'$  daily as patient tolerates medications.

## 2022-07-06 NOTE — Assessment & Plan Note (Signed)
Labs ordered at visit today.  Will make recommendations based on lab results.   

## 2022-07-06 NOTE — Assessment & Plan Note (Signed)
Last A1c was 6.4 in June. Labs ordered today. Will make recommendations based on lab results.

## 2022-07-06 NOTE — Progress Notes (Signed)
BP 119/74   Pulse 87   Temp (!) 97.5 F (36.4 C) (Oral)   Wt 159 lb 3.2 oz (72.2 kg)   SpO2 95%   BMI 25.70 kg/m    Subjective:    Patient ID: Terry French, male    DOB: 1950-12-07, 71 y.o.   MRN: [id number]  HPI: Terry French is a 71 y.o. male  Chief Complaint  Patient presents with   Hypertension   Hyperlipidemia   Diabetes    3 month follow    COPD COPD status: improved Satisfied with current treatment?: yes Oxygen use: no Dyspnea frequency: daily Cough frequency: daily Rescue inhaler frequency: a couple times a week Limitation of activity: no Productive cough: yes Last Spirometry:  Pneumovax: up to date Influenza: Not up to Date  CHRONIC KIDNEY DISEASE CKD status: controlled Medications renally dose: yes Previous renal evaluation: no Pneumovax:  Up to Date Influenza Vaccine:  Not up to Date   Relevant past medical, surgical, family and social history reviewed and updated as indicated. Interim medical history since our last visit reviewed. Allergies and medications reviewed and updated.  Review of Systems  Respiratory:  Negative for cough, shortness of breath and wheezing.     Per HPI unless specifically indicated above     Objective:    BP 119/74   Pulse 87   Temp (!) 97.5 F (36.4 C) (Oral)   Wt 159 lb 3.2 oz (72.2 kg)   SpO2 95%   BMI 25.70 kg/m   Wt Readings from Last 3 Encounters:  07/06/22 159 lb 3.2 oz (72.2 kg)  07/05/22 155 lb (70.3 kg)  04/05/22 161 lb 12.8 oz (73.4 kg)    Physical Exam Vitals and nursing note reviewed.  Constitutional:      General: He is not in acute distress.    Appearance: Normal appearance. He is not ill-appearing, toxic-appearing or diaphoretic.  HENT:     Head: Normocephalic.     Right Ear: External ear normal.     Left Ear: External ear normal.     Nose: Nose normal. No congestion or rhinorrhea.     Mouth/Throat:     Mouth: Mucous membranes are moist.  Eyes:     General:        Right eye: No  discharge.        Left eye: No discharge.     Extraocular Movements: Extraocular movements intact.     Conjunctiva/sclera: Conjunctivae normal.     Pupils: Pupils are equal, round, and reactive to light.  Cardiovascular:     Rate and Rhythm: Normal rate and regular rhythm.     Heart sounds: No murmur heard. Pulmonary:     Effort: Pulmonary effort is normal. No respiratory distress.     Breath sounds: Wheezing present. No rhonchi or rales.  Abdominal:     General: Abdomen is flat. Bowel sounds are normal.  Musculoskeletal:     Cervical back: Normal range of motion and neck supple.  Skin:    General: Skin is warm and dry.     Capillary Refill: Capillary refill takes less than 2 seconds.  Neurological:     General: No focal deficit present.     Mental Status: He is alert and oriented to person, place, and time.  Psychiatric:        Mood and Affect: Mood normal.        Behavior: Behavior normal.        Thought Content: Thought content  normal.        Judgment: Judgment normal.     Results for orders placed or performed in visit on 04/25/22  Cologuard  Result Value Ref Range   COLOGUARD Positive (A) Negative      Assessment & Plan:   Problem List Items Addressed This Visit       Cardiovascular and Mediastinum   Atherosclerosis of aorta (HCC)    Chronic. Will increase Crestor to 51m daily.  Goal is to increase to 262mdaily as patient tolerates medications.       Relevant Medications   rosuvastatin (CRESTOR) 10 MG tablet   Other Relevant Orders   Comp Met (CMET)     Respiratory   [place]    Chronic. Not well controlled.  Does not have Symbicort or Albuterol.  Agrees to pick them up from the pharmacy. Discussed how to properly use and precautions to take.  Follow up in 3 months.  Call sooner if concerns arise.      Relevant Medications   budesonide-formoterol (SYMBICORT) 160-4.5 MCG/ACT inhaler   albuterol (VENTOLIN HFA) 108 (90 Base) MCG/ACT  inhaler   Other Relevant Orders   Comp Met (CMET)     Genitourinary   CKD (chronic kidney disease) stage 3, GFR 30-59 ml/min (HCC)    Labs ordered at visit today.  Will make recommendations based on lab results.        Relevant Orders   Comp Met (CMET)     Other   Prediabetes    Last A1c was 6.4 in June. Labs ordered today. Will make recommendations based on lab results.       Relevant Orders   HgB A1c   Other Visit Diagnoses     Encounter for hepatitis C screening test for low risk patient       Relevant Orders   Hepatitis C Antibody   Need for influenza vaccination       Relevant Orders   Flu Vaccine QUAD High Dose(Fluad)        Follow up plan: Return in about 3 months (around 10/05/2022) for HTN, HLD, DM2 FU.

## 2022-07-07 LAB — COMPREHENSIVE METABOLIC PANEL
ALT: 16 IU/L (ref 0–44)
AST: 25 IU/L (ref 0–40)
Albumin/Globulin Ratio: 1.7 (ref 1.2–2.2)
Albumin: 4.6 g/dL (ref 3.9–4.9)
Alkaline Phosphatase: 67 IU/L (ref 44–121)
BUN/Creatinine Ratio: 12 (ref 10–24)
BUN: 20 mg/dL (ref 8–27)
Bilirubin Total: 0.4 mg/dL (ref 0.0–1.2)
CO2: 23 mmol/L (ref 20–29)
Calcium: 9.4 mg/dL (ref 8.6–10.2)
Chloride: 103 mmol/L (ref 96–106)
Creatinine, Ser: 1.69 mg/dL — ABNORMAL HIGH (ref 0.76–1.27)
Globulin, Total: 2.7 g/dL (ref 1.5–4.5)
Glucose: 76 mg/dL (ref 70–99)
Potassium: 4.1 mmol/L (ref 3.5–5.2)
Sodium: 142 mmol/L (ref 134–144)
Total Protein: 7.3 g/dL (ref 6.0–8.5)
eGFR: 43 mL/min/{1.73_m2} — ABNORMAL LOW (ref >59)

## 2022-07-07 LAB — HEPATITIS C ANTIBODY: Hep C Virus Ab: NONREACTIVE

## 2022-07-07 LAB — HEMOGLOBIN A1C
Est. average glucose Bld gHb Est-mCnc: 137 mg/dL
Hgb A1c MFr Bld: 6.4 % — ABNORMAL HIGH (ref 4.8–5.6)

## 2022-07-07 NOTE — Progress Notes (Signed)
Please let patient know that his lab work shows that his kidney function has declined some.  Recommend increasing water intake and avoiding NSAIDS such as ibuprofen, motrin or aleve.  A1c remains in the prediabetic range.  I recommend a low carb diet to help with this. Follow up as discussed.

## 2022-07-22 IMAGING — DX DG CHEST 2V
3 series · 3 of 3 positions shown · non-contrast
Comparison: Chest x-ray 01/14/2021.

CLINICAL DATA: 69-year-old male with history of shortness of
breath. Smoker's cough.

EXAM:
CHEST - 2 VIEW

[chest pa (1 of 2)]
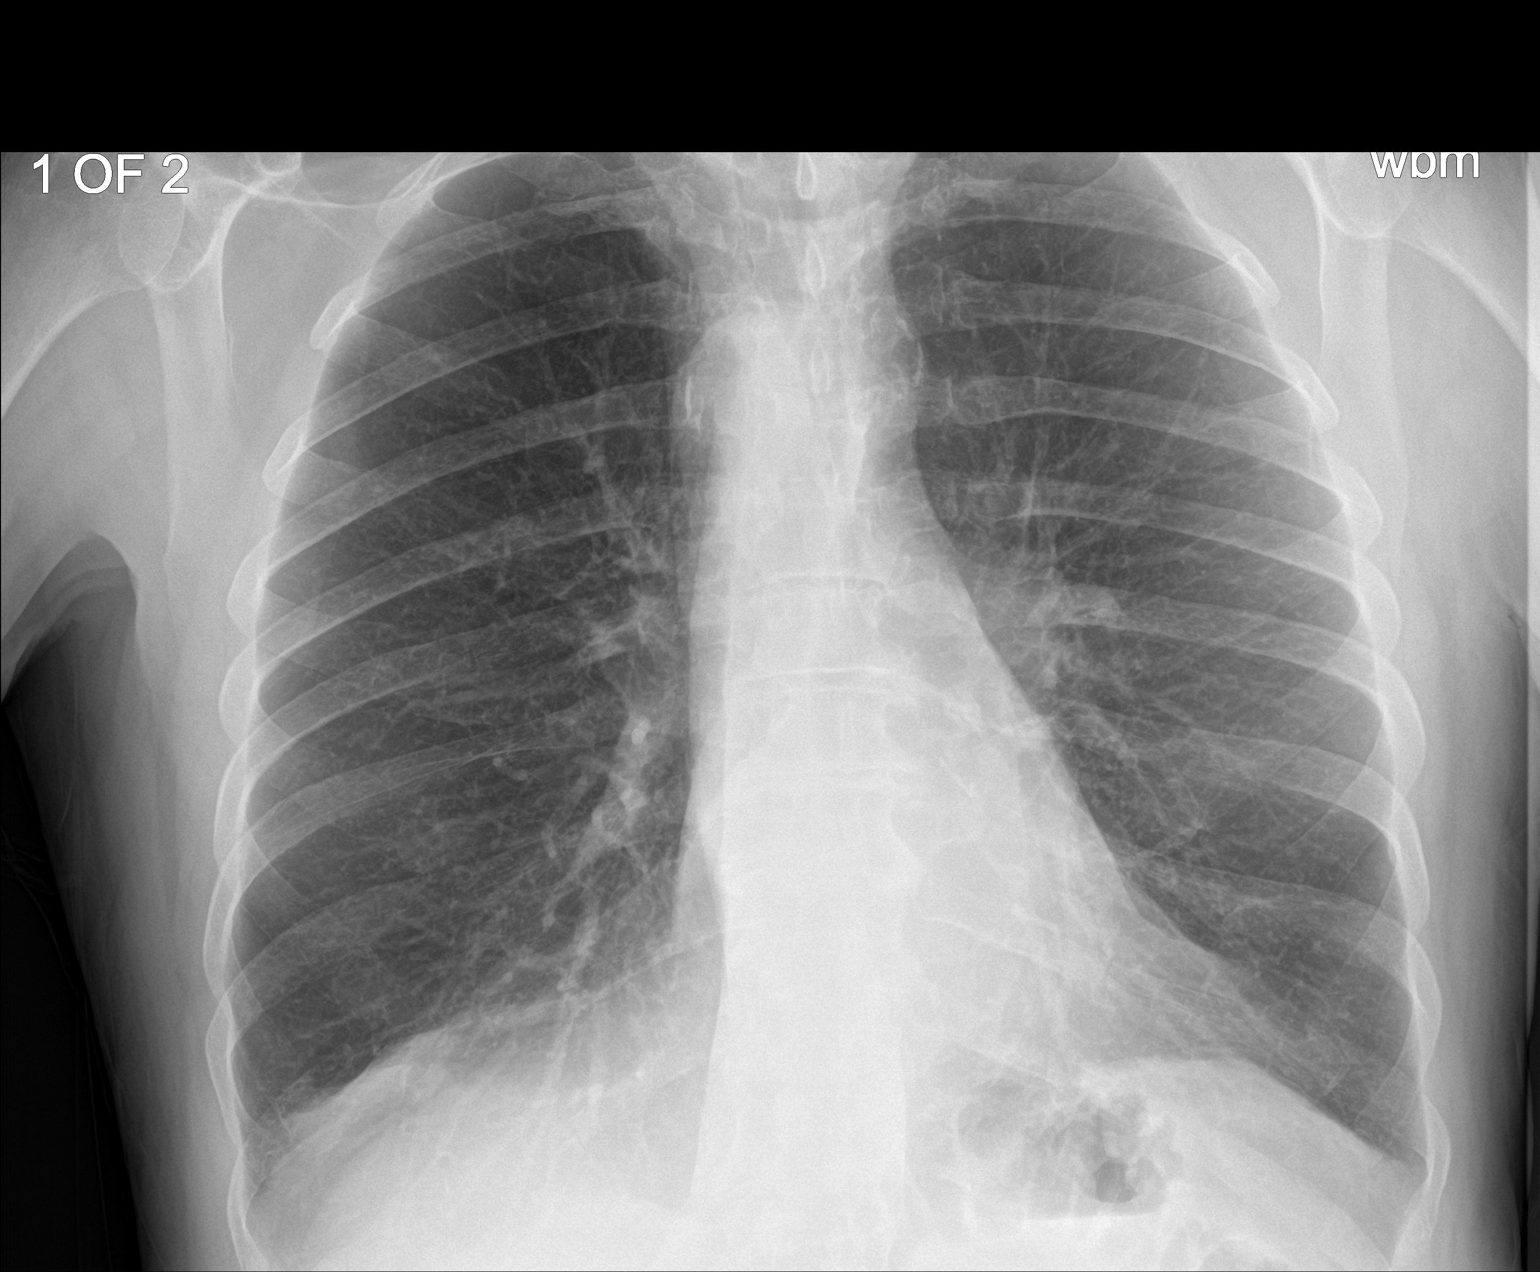

[chest lat]
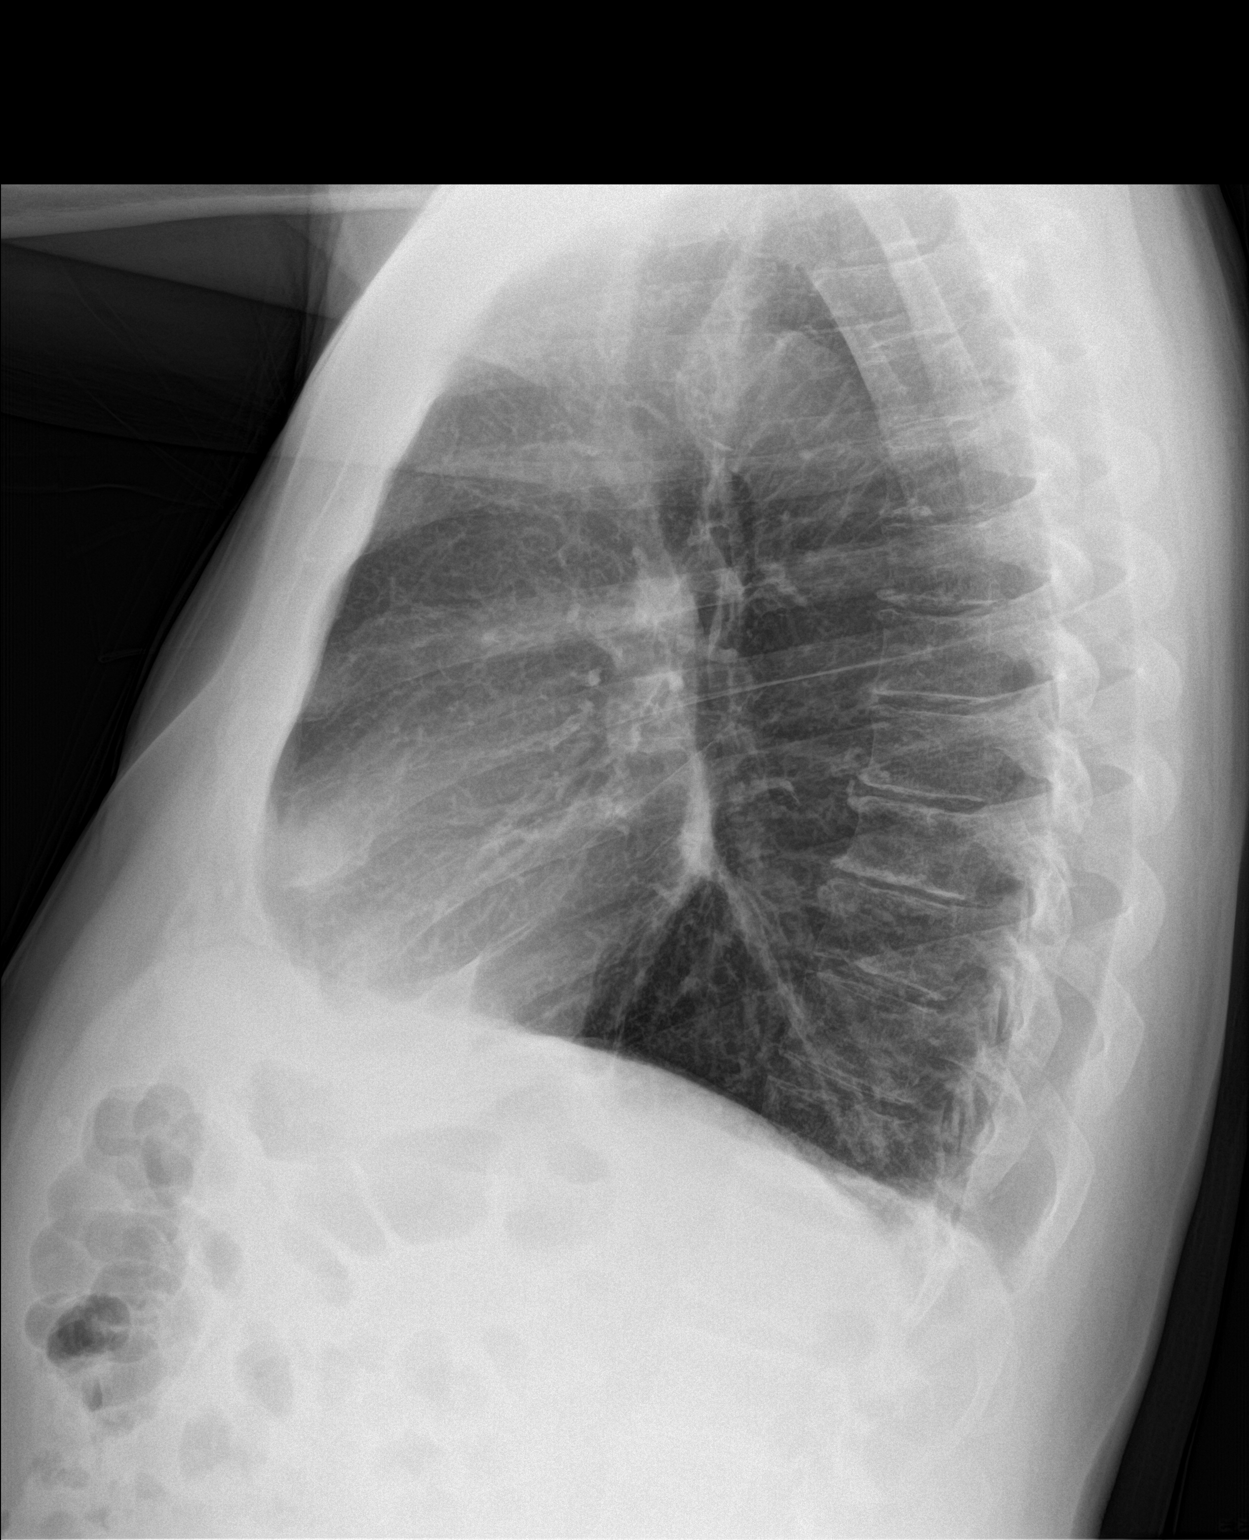

[chest pa (2 of 2)]
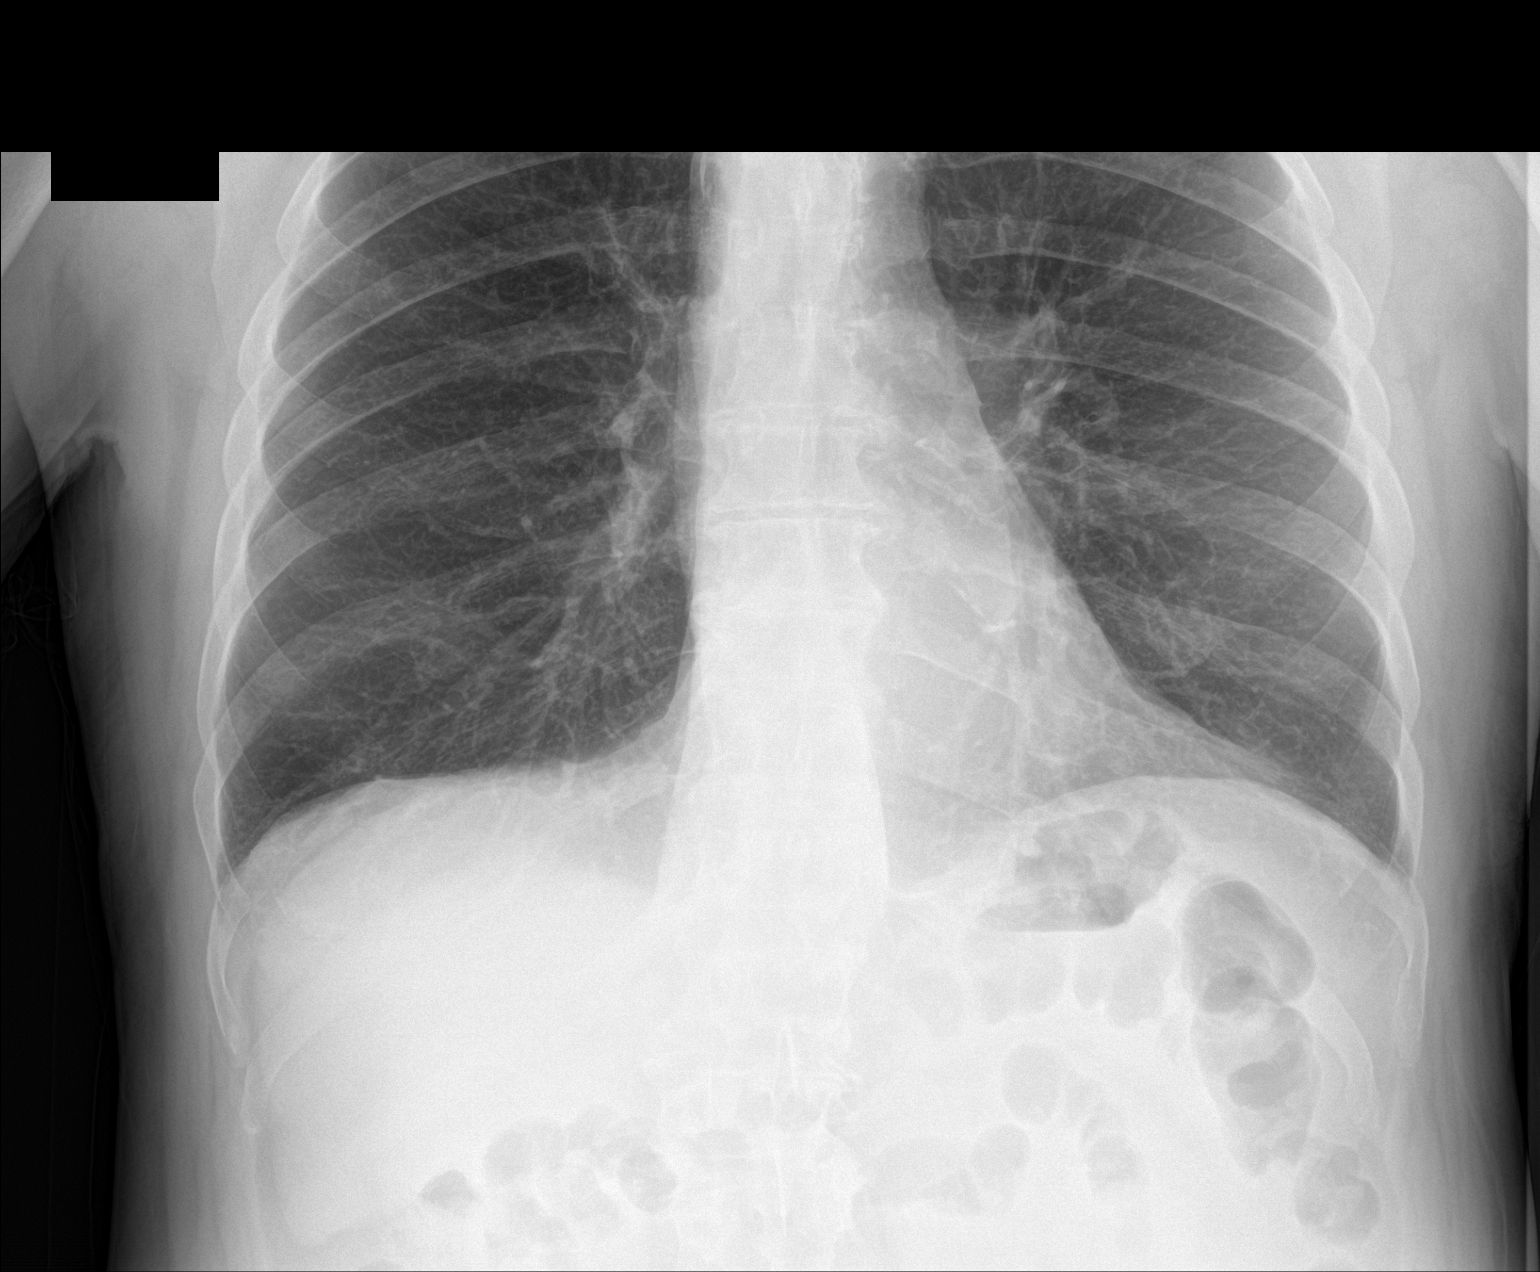

[3 of 3 positions shown; findings below may reference images not displayed]

FINDINGS: Lung volumes are increased with emphysematous changes. No
consolidative airspace disease. No pleural effusions. No
pneumothorax. No pulmonary nodule or mass noted. Pulmonary
vasculature and the cardiomediastinal silhouette are within normal
limits. Atherosclerosis in the thoracic aorta.
IMPRESSION: 1. No radiographic evidence of acute cardiopulmonary disease.
2. Emphysema.
3. Aortic atherosclerosis.

## 2022-08-08 ENCOUNTER — Telehealth: Payer: Self-pay

## 2022-08-08 NOTE — Progress Notes (Signed)
[facility]) Care Management  [place]   08/08/2022  AMADEO COKE 1950/12/15 [id number]  Reason for referral: Medication Assistance  Referral source: Jon Billings, NP Referral medication(s): Symbicort and Ventolin Current insurance:unable to verify with patient    Brief Summary:  [facility] Team received a referral for Mr. Secord for medication assistance. Patient was outreached on 10/18, 10/19, and 10/31 that were unsuccessful. Patient does not have a reliable telephone number on file as it is his sister-in-law ([name]) and brother's Shanon Brow) phone numbers- they who do not live him. Per Jackelyn Poling, her brother- in-law Shanon Brow works 4 am - 5 pm most days; both family members randomly visits Mr. Quinonez.   Given patient does not have a reliable method of telephonic communication, I thought it would be best to refer patient to be seen by the embedded pharmacist for an in-person office visit. Discussed patient with Arizona Constable, who agreed to taking over this medication assistance referral and he plans to reach out to provider regarding patient/referral.   Thank you for allowing pharmacy to be a part of this patient's care. Kristeen Miss, PharmD Clinical Pharmacist [place] Cell: [phone]

## 2022-08-10 ENCOUNTER — Telehealth: Payer: Self-pay

## 2022-08-10 NOTE — Progress Notes (Signed)
[facility]) Care Management  [place]   08/10/2022  RANARD HARTE 1951/09/09 [id number]  Reason for referral: Medication Assistance  Referral source: Jon Billings, NP Referral medication(s): Symbicort and Ventolin Current insurance:unable to verify with patient    Brief Summary:  [facility] Team received a referral for Mr. Basso for medication assistance. This pharmacist was unable to reach patient as he does not have a telephone and relied on family. As such, Arizona Constable, PharmD agreed to take referral. Bertis Ruddy called today and stated that she is with Mr. Bon everyday and to call her number [phone]). [name] never provided Tammie as a reliable contact when asked with all three encounters.   Informed Mr. Nova and Tammie to reach out to clinic to schedule and appointment with Ovid Curd.   Thank you for allowing pharmacy to be a part of this patient's care. Kristeen Miss, PharmD Clinical Pharmacist [place] Cell: [phone]

## 2022-09-14 ENCOUNTER — Ambulatory Visit (INDEPENDENT_AMBULATORY_CARE_PROVIDER_SITE_OTHER): Payer: Medicare Other | Admitting: Nurse Practitioner

## 2022-09-14 ENCOUNTER — Encounter: Payer: Self-pay | Admitting: Nurse Practitioner

## 2022-09-14 VITALS — BP 111/75 | HR 76 | Temp 97.6°F | Wt 156.1 lb

## 2022-09-14 DIAGNOSIS — J441 Chronic obstructive pulmonary disease with (acute) exacerbation: Secondary | ICD-10-CM

## 2022-09-14 MED ORDER — PREDNISONE 10 MG PO TABS: 10.0000 mg | ORAL_TABLET | Freq: Every day | ORAL | 0 refills | Status: DC

## 2022-09-14 NOTE — Progress Notes (Signed)
BP 111/75   Pulse 76   Temp 97.6 F (36.4 C) (Oral)   Wt 156 lb 1.6 oz (70.8 kg)   SpO2 93%   BMI 25.20 kg/m    Subjective:    Patient ID: Terry French, male    DOB: 1951-09-14, 71 y.o.   MRN: [id number]  HPI: Terry French is a 71 y.o. male  Chief Complaint  Patient presents with   Cough    X 3-4 weeks   UPPER RESPIRATORY TRACT INFECTION Worst symptom: cough ongoing x 3-4 weeks.  Smoking 1 ppf Fever: no Cough: yes Shortness of breath: yes Wheezing: yes Chest pain: yes Chest tightness: yes Chest congestion: yes Nasal congestion: no Runny nose: yes Post nasal drip: no Sneezing: no Sore throat: no Swollen glands: no Sinus pressure: no Headache: yes Face pain: no Toothache: no Ear pain: no bilateral Ear pressure: no bilateral Eyes red/itching:no Eye drainage/crusting: no  Vomiting: no Rash: no Fatigue: yes Sick contacts: no Strep contacts: no  Context: stable Recurrent sinusitis: no Relief with OTC cold/cough medications: no  Treatments attempted: none Using Albuterol   Relevant past medical, surgical, family and social history reviewed and updated as indicated. Interim medical history since our last visit reviewed. Allergies and medications reviewed and updated.  Review of Systems  HENT:  Positive for congestion.   Respiratory:  Positive for cough, chest tightness, shortness of breath and wheezing.   Cardiovascular:  Negative for chest pain.    Per HPI unless specifically indicated above     Objective:    BP 111/75   Pulse 76   Temp 97.6 F (36.4 C) (Oral)   Wt 156 lb 1.6 oz (70.8 kg)   SpO2 93%   BMI 25.20 kg/m   Wt Readings from Last 3 Encounters:  09/14/22 156 lb 1.6 oz (70.8 kg)  07/06/22 159 lb 3.2 oz (72.2 kg)  07/05/22 155 lb (70.3 kg)    Physical Exam Vitals and nursing note reviewed.  Constitutional:      General: He is not in acute distress.    Appearance: Normal appearance. He is not ill-appearing, toxic-appearing or  diaphoretic.  HENT:     Head: Normocephalic.     Right Ear: External ear normal.     Left Ear: External ear normal.     Nose: Congestion present. No rhinorrhea.     Mouth/Throat:     Mouth: Mucous membranes are moist.  Eyes:     General:        Right eye: No discharge.        Left eye: No discharge.     Extraocular Movements: Extraocular movements intact.     Conjunctiva/sclera: Conjunctivae normal.     Pupils: Pupils are equal, round, and reactive to light.  Cardiovascular:     Rate and Rhythm: Normal rate and regular rhythm.     Heart sounds: No murmur heard. Pulmonary:     Effort: Pulmonary effort is normal. No respiratory distress.     Breath sounds: Normal breath sounds. No wheezing, rhonchi or rales.  Abdominal:     General: Abdomen is flat. Bowel sounds are normal.  Musculoskeletal:     Cervical back: Normal range of motion and neck supple.  Skin:    General: Skin is warm and dry.     Capillary Refill: Capillary refill takes less than 2 seconds.  Neurological:     General: No focal deficit present.     Mental Status: He is alert and  oriented to person, place, and time.  Psychiatric:        Mood and Affect: Mood normal.        Behavior: Behavior normal.        Thought Content: Thought content normal.        Judgment: Judgment normal.     Results for orders placed or performed in visit on 07/06/22  Comp Met (CMET)  Result Value Ref Range   Glucose 76 70 - 99 mg/dL   BUN 20 8 - 27 mg/dL   Creatinine, Ser 1.69 (H) 0.76 - 1.27 mg/dL   eGFR 43 (L) >59 mL/min/1.73   BUN/Creatinine Ratio 12 10 - 24   Sodium 142 134 - 144 mmol/L   Potassium 4.1 3.5 - 5.2 mmol/L   Chloride 103 96 - 106 mmol/L   CO2 23 20 - 29 mmol/L   Calcium 9.4 8.6 - 10.2 mg/dL   Total Protein 7.3 6.0 - 8.5 g/dL   Albumin 4.6 3.9 - 4.9 g/dL   Globulin, Total 2.7 1.5 - 4.5 g/dL   Albumin/Globulin Ratio 1.7 1.2 - 2.2   Bilirubin Total 0.4 0.0 - 1.2 mg/dL   Alkaline Phosphatase 67 44 - 121 IU/L    AST 25 0 - 40 IU/L   ALT 16 0 - 44 IU/L  HgB A1c  Result Value Ref Range   Hgb A1c MFr Bld 6.4 (H) 4.8 - 5.6 %   Est. average glucose Bld gHb Est-mCnc 137 mg/dL  Hepatitis C Antibody  Result Value Ref Range   Hep C Virus Ab Non Reactive Non Reactive      Assessment & Plan:   Problem List Items Addressed This Visit   None Visit Diagnoses     COPD exacerbation ([place])    -  Primary   Will treat with prednisone taper x 12 days. Continue with PRN albuterol.  Sample given of Breztri. Will send RX once patient has prescription coverage Jan 1.   Relevant Medications   predniSONE (DELTASONE) 10 MG tablet        Follow up plan: Return in about 2 weeks (around 09/28/2022) for COPD .

## 2022-09-28 ENCOUNTER — Encounter: Payer: Self-pay | Admitting: Nurse Practitioner

## 2022-09-28 ENCOUNTER — Ambulatory Visit (INDEPENDENT_AMBULATORY_CARE_PROVIDER_SITE_OTHER): Payer: Medicare Other | Admitting: Nurse Practitioner

## 2022-09-28 DIAGNOSIS — J439 Emphysema, unspecified: Secondary | ICD-10-CM

## 2022-09-28 MED ORDER — TRIAMCINOLONE ACETONIDE 40 MG/ML IJ SUSP
40.0000 mg | Freq: Once | INTRAMUSCULAR | Status: AC
  Administered 2022-09-28: 40 mg via INTRAMUSCULAR

## 2022-09-28 MED ORDER — ALBUTEROL SULFATE HFA 108 (90 BASE) MCG/ACT IN AERS: 1.0000 | INHALATION_SPRAY | RESPIRATORY_TRACT | 1 refills | Status: DC | PRN

## 2022-09-28 NOTE — Progress Notes (Signed)
BP 117/62   Pulse 77   Temp 97.8 F (36.6 C) (Oral)   Wt 160 lb 9.6 oz (72.8 kg)   SpO2 96%   BMI 25.92 kg/m    Subjective:    Patient ID: Terry French, male    DOB: [birth year], 71 y.o.   MRN: [id number]  HPI: Terry French is a 71 y.o. male  Chief Complaint  Patient presents with   COPD   UPPER RESPIRATORY TRACT INFECTION Worst symptom: feels like his breathing is better.  He would like a refill on his albuterol.   Fever: no Cough: yes Shortness of breath: yes Wheezing: yes Chest pain: yes Chest tightness: yes Chest congestion: yes Nasal congestion: no Runny nose: yes Post nasal drip: no Sneezing: no Sore throat: no Swollen glands: no Sinus pressure: no Headache: yes Face pain: no Toothache: no Ear pain: no bilateral Ear pressure: no bilateral Eyes red/itching:no Eye drainage/crusting: no  Vomiting: no Rash: no Fatigue: yes Sick contacts: no Strep contacts: no  Context: stable Recurrent sinusitis: no Relief with OTC cold/cough medications: no  Treatments attempted: none Using Albuterol   Relevant past medical, surgical, family and social history reviewed and updated as indicated. Interim medical history since our last visit reviewed. Allergies and medications reviewed and updated.  Review of Systems  HENT:  Negative for congestion.   Respiratory:  Positive for cough and wheezing. Negative for chest tightness and shortness of breath.   Cardiovascular:  Negative for chest pain.    Per HPI unless specifically indicated above     Objective:    BP 117/62   Pulse 77   Temp 97.8 F (36.6 C) (Oral)   Wt 160 lb 9.6 oz (72.8 kg)   SpO2 96%   BMI 25.92 kg/m   Wt Readings from Last 3 Encounters:  09/28/22 160 lb 9.6 oz (72.8 kg)  09/14/22 156 lb 1.6 oz (70.8 kg)  07/06/22 159 lb 3.2 oz (72.2 kg)    Physical Exam Vitals and nursing note reviewed.  Constitutional:      General: He is not in acute distress.    Appearance: Normal appearance. He  is not ill-appearing, toxic-appearing or diaphoretic.  HENT:     Head: Normocephalic.     Right Ear: External ear normal.     Left Ear: External ear normal.     Nose: Congestion present. No rhinorrhea.     Mouth/Throat:     Mouth: Mucous membranes are moist.  Eyes:     General:        Right eye: No discharge.        Left eye: No discharge.     Extraocular Movements: Extraocular movements intact.     Conjunctiva/sclera: Conjunctivae normal.     Pupils: Pupils are equal, round, and reactive to light.  Cardiovascular:     Rate and Rhythm: Normal rate and regular rhythm.     Heart sounds: No murmur heard. Pulmonary:     Effort: Pulmonary effort is normal. No respiratory distress.     Breath sounds: Wheezing present. No rhonchi or rales.  Abdominal:     General: Abdomen is flat. Bowel sounds are normal.  Musculoskeletal:     Cervical back: Normal range of motion and neck supple.  Skin:    General: Skin is warm and dry.     Capillary Refill: Capillary refill takes less than 2 seconds.  Neurological:     General: No focal deficit present.     Mental  Status: He is alert and oriented to person, place, and time.  Psychiatric:        Mood and Affect: Mood normal.        Behavior: Behavior normal.        Thought Content: Thought content normal.        Judgment: Judgment normal.     Results for orders placed or performed in visit on 07/06/22  Comp Met (CMET)  Result Value Ref Range   Glucose 76 70 - 99 mg/dL   BUN 20 8 - 27 mg/dL   Creatinine, Ser 1.69 (H) 0.76 - 1.27 mg/dL   eGFR 43 (L) >59 mL/min/1.73   BUN/Creatinine Ratio 12 10 - 24   Sodium 142 134 - 144 mmol/L   Potassium 4.1 3.5 - 5.2 mmol/L   Chloride 103 96 - 106 mmol/L   CO2 23 20 - 29 mmol/L   Calcium 9.4 8.6 - 10.2 mg/dL   Total Protein 7.3 6.0 - 8.5 g/dL   Albumin 4.6 3.9 - 4.9 g/dL   Globulin, Total 2.7 1.5 - 4.5 g/dL   Albumin/Globulin Ratio 1.7 1.2 - 2.2   Bilirubin Total 0.4 0.0 - 1.2 mg/dL   Alkaline  Phosphatase 67 44 - 121 IU/L   AST 25 0 - 40 IU/L   ALT 16 0 - 44 IU/L  HgB A1c  Result Value Ref Range   Hgb A1c MFr Bld 6.4 (H) 4.8 - 5.6 %   Est. average glucose Bld gHb Est-mCnc 137 mg/dL  Hepatitis C Antibody  Result Value Ref Range   Hep C Virus Ab Non Reactive Non Reactive      Assessment & Plan:   Problem List Items Addressed This Visit       Respiratory   Emphysema lung (HCC)    Chronic.  Will give Kenalog in the office.  Does not currently have prescription drug coverage and couldn't pick up up the symbicort.  Will give Breztri sample.  Will Follow up in 1 month.  Call sooner if concerns arise.       Relevant Medications   albuterol (VENTOLIN HFA) 108 (90 Base) MCG/ACT inhaler     Follow up plan: Return in about 1 month (around 10/29/2022) for HTN, HLD, DM2 FU.

## 2022-09-29 ENCOUNTER — Other Ambulatory Visit: Payer: Self-pay | Admitting: Nurse Practitioner

## 2022-09-29 NOTE — Assessment & Plan Note (Signed)
Chronic.  Will give Kenalog in the office.  Does not currently have prescription drug coverage and couldn't pick up up the symbicort.  Will give Breztri sample.  Will Follow up in 1 month.  Call sooner if concerns arise.

## 2022-09-29 NOTE — Telephone Encounter (Signed)
Requested Prescriptions  Pending Prescriptions Disp Refills   rosuvastatin (CRESTOR) 5 MG tablet [Pharmacy Med Name: ROSUVASTATIN CALCIUM 5 MG TAB] 90 tablet 1    Sig: TAKE 1 TABLET BY MOUTH DAILY     Cardiovascular:  Antilipid - Statins 2 Failed - 09/29/2022  6:24 AM      Failed - Cr in normal range and within 360 days    Creatinine, Ser  Date Value Ref Range Status  07/06/2022 1.69 (H) 0.76 - 1.27 mg/dL Final         Failed - Lipid Panel in normal range within the last 12 months    Cholesterol, Total  Date Value Ref Range Status  02/13/2022 236 (H) 100 - 199 mg/dL Final   LDL Chol Calc (NIH)  Date Value Ref Range Status  02/13/2022 171 (H) 0 - 99 mg/dL Final   HDL  Date Value Ref Range Status  02/13/2022 43 >39 mg/dL Final   Triglycerides  Date Value Ref Range Status  02/13/2022 121 0 - 149 mg/dL Final         Passed - Patient is not pregnant      Passed - Valid encounter within last 12 months    Recent Outpatient Visits           Yesterday Pulmonary emphysema, unspecified emphysema type ([place])   [place], Karen, NP   2 weeks ago COPD exacerbation ([place])   [facility] Jon Billings, NP   2 months ago Pulmonary emphysema, unspecified emphysema type ([place])   [facility] Jon Billings, NP   5 months ago Pulmonary emphysema, unspecified emphysema type ([place])   [place], Karen, NP   7 months ago Atherosclerosis of aorta [facility])   [facility] Jon Billings, NP       Future Appointments             In 1 month Jon Billings, NP [facility], [place]

## 2022-10-05 ENCOUNTER — Ambulatory Visit: Payer: Medicare Other | Admitting: Nurse Practitioner

## 2022-10-16 ENCOUNTER — Other Ambulatory Visit: Payer: Self-pay | Admitting: Nurse Practitioner

## 2022-10-17 NOTE — Telephone Encounter (Signed)
Requested medication (s) are due for refill today:   Provider to  review  Requested medication (s) are on the active medication list:   Yes  Future visit scheduled:   Yes 10/30/2022   Last ordered: 09/14/2022 #84, 0 refills  Returned because it's  a non delegated refill.     Requested Prescriptions  Pending Prescriptions Disp Refills   predniSONE (DELTASONE) 10 MG tablet [Pharmacy Med Name: PREDNISONE 10 MG TABLET] 84 tablet 0    Sig: Take 1 tablet (10 mg total) by mouth daily with breakfast. Take 6 for 2 days, 5 for 2 days, and decrease by 1 every other day until course is complete.     Not Delegated - Endocrinology:  Oral Corticosteroids Failed - 10/16/2022  9:03 AM      Failed - This refill cannot be delegated      Failed - Manual Review: Eye exam for IOP if prolonged treatment      Failed - Bone Mineral Density or Dexa Scan completed in the last 2 years      Passed - Glucose (serum) in normal range and within 180 days    Glucose  Date Value Ref Range Status  07/06/2022 76 70 - 99 mg/dL Final   Glucose, Bld  Date Value Ref Range Status  01/14/2021 108 (H) 70 - 99 mg/dL Final    Comment:    Glucose reference range applies only to samples taken after fasting for at least 8 hours.         Passed - K in normal range and within 180 days    Potassium  Date Value Ref Range Status  07/06/2022 4.1 3.5 - 5.2 mmol/L Final         Passed - Na in normal range and within 180 days    Sodium  Date Value Ref Range Status  07/06/2022 142 134 - 144 mmol/L Final         Passed - Last BP in normal range    BP Readings from Last 1 Encounters:  09/28/22 117/62         Passed - Valid encounter within last 6 months    Recent Outpatient Visits           2 weeks ago Pulmonary emphysema, unspecified emphysema type ([place])   [facility] Jon Billings, NP   1 month ago COPD exacerbation ([place])   [facility] Jon Billings, NP   3 months ago Pulmonary  emphysema, unspecified emphysema type ([place])   [facility] Jon Billings, NP   6 months ago Pulmonary emphysema, unspecified emphysema type ([place])   [facility] Jon Billings, NP   8 months ago Atherosclerosis of aorta [facility])   [facility] Jon Billings, NP       Future Appointments             In 1 week Jon Billings, NP [facility], [place]

## 2022-10-30 ENCOUNTER — Ambulatory Visit (INDEPENDENT_AMBULATORY_CARE_PROVIDER_SITE_OTHER): Payer: PPO | Admitting: Nurse Practitioner

## 2022-10-30 ENCOUNTER — Encounter: Payer: Self-pay | Admitting: Nurse Practitioner

## 2022-10-30 VITALS — BP 112/65 | HR 71 | Temp 97.7°F | Wt 156.6 lb

## 2022-10-30 DIAGNOSIS — E1159 Type 2 diabetes mellitus with other circulatory complications: Secondary | ICD-10-CM

## 2022-10-30 DIAGNOSIS — E1169 Type 2 diabetes mellitus with other specified complication: Secondary | ICD-10-CM

## 2022-10-30 DIAGNOSIS — N1831 Chronic kidney disease, stage 3a: Secondary | ICD-10-CM

## 2022-10-30 DIAGNOSIS — J439 Emphysema, unspecified: Secondary | ICD-10-CM

## 2022-10-30 DIAGNOSIS — I700 Atherosclerosis of aorta: Secondary | ICD-10-CM

## 2022-10-30 DIAGNOSIS — E1122 Type 2 diabetes mellitus with diabetic chronic kidney disease: Secondary | ICD-10-CM | POA: Diagnosis not present

## 2022-10-30 DIAGNOSIS — N183 Chronic kidney disease, stage 3 unspecified: Secondary | ICD-10-CM

## 2022-10-30 DIAGNOSIS — E785 Hyperlipidemia, unspecified: Secondary | ICD-10-CM

## 2022-10-30 LAB — MICROALBUMIN, URINE WAIVED
Creatinine, Urine Waived: 100 mg/dL (ref 10–300)
Microalb, Ur Waived: 30 mg/L — ABNORMAL HIGH (ref 0–19)
Microalb/Creat Ratio: <30 mg/g (ref <30)

## 2022-10-30 NOTE — Assessment & Plan Note (Signed)
Chronic.  Controlled.  Continue with current medication regimen of Spiriva and Albuterol.  Labs ordered today.  Return to clinic in 6 months for reevaluation.  Call sooner if concerns arise.

## 2022-10-30 NOTE — Progress Notes (Signed)
BP 112/65   Pulse 71   Temp 97.7 F (36.5 C) (Oral)   Wt 156 lb 9.6 oz (71 kg)   SpO2 97%   BMI 25.28 kg/m    Subjective:    Patient ID: Terry French, male    DOB: Nov 13, 1950, 72 y.o.   MRN: [id number]  HPI: Terry French is a 72 y.o. male  Chief Complaint  Patient presents with   Diabetes   Hyperlipidemia   Hypertension   COPD COPD status: controlled Satisfied with current treatment?: yes Oxygen use: no Dyspnea frequency: daily Cough frequency: daily Rescue inhaler frequency: a couple times a week Limitation of activity: no Productive cough: yes Last Spirometry:  Pneumovax: up to date Influenza: Not up to Date  CHRONIC KIDNEY DISEASE CKD status: controlled Medications renally dose: yes Previous renal evaluation: no Pneumovax:  Up to Date Influenza Vaccine:  Not up to Date  DIABETES Hypoglycemic episodes:no Polydipsia/polyuria: no Visual disturbance: no Chest pain: no Paresthesias: no Glucose Monitoring: no  Accucheck frequency: Not Checking  Fasting glucose:  Post prandial:  Evening:  Before meals: Taking Insulin?: no  Long acting insulin:  Short acting insulin: Blood Pressure Monitoring: not checking Retinal Examination: Not up to Date Foot Exam: Up to Date Diabetic Education: Not Completed Pneumovax: Up to Date Influenza: Up to Date Aspirin: no   Relevant past medical, surgical, family and social history reviewed and updated as indicated. Interim medical history since our last visit reviewed. Allergies and medications reviewed and updated.  Review of Systems  Eyes:  Negative for visual disturbance.  Respiratory:  Negative for cough, chest tightness, shortness of breath and wheezing.   Cardiovascular:  Negative for chest pain, palpitations and leg swelling.  Endocrine: Negative for polydipsia and polyuria.  Neurological:  Negative for dizziness, light-headedness, numbness and headaches.    Per HPI unless specifically indicated above      Objective:    BP 112/65   Pulse 71   Temp 97.7 F (36.5 C) (Oral)   Wt 156 lb 9.6 oz (71 kg)   SpO2 97%   BMI 25.28 kg/m   Wt Readings from Last 3 Encounters:  10/30/22 156 lb 9.6 oz (71 kg)  09/28/22 160 lb 9.6 oz (72.8 kg)  09/14/22 156 lb 1.6 oz (70.8 kg)    Physical Exam Vitals and nursing note reviewed.  Constitutional:      General: He is not in acute distress.    Appearance: Normal appearance. He is not ill-appearing, toxic-appearing or diaphoretic.  HENT:     Head: Normocephalic.     Right Ear: External ear normal.     Left Ear: External ear normal.     Nose: Nose normal. No congestion or rhinorrhea.     Mouth/Throat:     Mouth: Mucous membranes are moist.  Eyes:     General:        Right eye: No discharge.        Left eye: No discharge.     Extraocular Movements: Extraocular movements intact.     Conjunctiva/sclera: Conjunctivae normal.     Pupils: Pupils are equal, round, and reactive to light.  Cardiovascular:     Rate and Rhythm: Normal rate and regular rhythm.     Heart sounds: No murmur heard. Pulmonary:     Effort: Pulmonary effort is normal. No respiratory distress.     Breath sounds: No wheezing, rhonchi or rales.  Abdominal:     General: Abdomen is flat. Bowel sounds  are normal.  Musculoskeletal:     Cervical back: Normal range of motion and neck supple.  Skin:    General: Skin is warm and dry.     Capillary Refill: Capillary refill takes less than 2 seconds.  Neurological:     General: No focal deficit present.     Mental Status: He is alert and oriented to person, place, and time.  Psychiatric:        Mood and Affect: Mood normal.        Behavior: Behavior normal.        Thought Content: Thought content normal.        Judgment: Judgment normal.     Results for orders placed or performed in visit on 07/06/22  Comp Met (CMET)  Result Value Ref Range   Glucose 76 70 - 99 mg/dL   BUN 20 8 - 27 mg/dL   Creatinine, Ser 1.69 (H) 0.76 -  1.27 mg/dL   eGFR 43 (L) >59 mL/min/1.73   BUN/Creatinine Ratio 12 10 - 24   Sodium 142 134 - 144 mmol/L   Potassium 4.1 3.5 - 5.2 mmol/L   Chloride 103 96 - 106 mmol/L   CO2 23 20 - 29 mmol/L   Calcium 9.4 8.6 - 10.2 mg/dL   Total Protein 7.3 6.0 - 8.5 g/dL   Albumin 4.6 3.9 - 4.9 g/dL   Globulin, Total 2.7 1.5 - 4.5 g/dL   Albumin/Globulin Ratio 1.7 1.2 - 2.2   Bilirubin Total 0.4 0.0 - 1.2 mg/dL   Alkaline Phosphatase 67 44 - 121 IU/L   AST 25 0 - 40 IU/L   ALT 16 0 - 44 IU/L  HgB A1c  Result Value Ref Range   Hgb A1c MFr Bld 6.4 (H) 4.8 - 5.6 %   Est. average glucose Bld gHb Est-mCnc 137 mg/dL  Hepatitis C Antibody  Result Value Ref Range   Hep C Virus Ab Non Reactive Non Reactive      Assessment & Plan:   Problem List Items Addressed This Visit       Cardiovascular and Mediastinum   Atherosclerosis of aorta (HCC) - Primary    Chronic. Continue with Crestor.  Labs ordered today.        Relevant Orders   Comp Met (CMET)     Respiratory   Emphysema lung (HCC)    Chronic.  Controlled.  Continue with current medication regimen of Spiriva and Albuterol.  Labs ordered today.  Return to clinic in 6 months for reevaluation.  Call sooner if concerns arise.           Endocrine   Diabetes type 2, controlled ([place])    Chronic.  Controlled without medication.  Foot exam done.  Microalbumin checked.   Labs ordered today.  Return to clinic in 6 months for reevaluation.  Call sooner if concerns arise.        Relevant Orders   HgB A1c   Microalbumin, Urine Waived     Genitourinary   CKD (chronic kidney disease) stage 3, GFR 30-59 ml/min (HCC)    Chronic.  Controlled.  Continue with current medication regimen.  Labs ordered today.  Return to clinic in 6 months for reevaluation.  Call sooner if concerns arise.        Other Visit Diagnoses     Hyperlipidemia associated with type 2 diabetes mellitus ([place])       Relevant Orders   Lipid Profile        Follow  up  plan: Return in about 5 months (around 03/31/2023) for HTN, HLD, DM2 FU.

## 2022-10-30 NOTE — Assessment & Plan Note (Signed)
Chronic.  Controlled.  Continue with current medication regimen.  Labs ordered today.  Return to clinic in 6 months for reevaluation.  Call sooner if concerns arise.  ? ?

## 2022-10-30 NOTE — Assessment & Plan Note (Signed)
Chronic.  Controlled without medication.  Foot exam done.  Microalbumin checked.   Labs ordered today.  Return to clinic in 6 months for reevaluation.  Call sooner if concerns arise.

## 2022-10-30 NOTE — Assessment & Plan Note (Addendum)
Chronic. Continue with Crestor.  Labs ordered today.

## 2022-10-31 LAB — COMPREHENSIVE METABOLIC PANEL WITH GFR
ALT: 17 IU/L (ref 0–44)
AST: 20 IU/L (ref 0–40)
Albumin/Globulin Ratio: 1.8 (ref 1.2–2.2)
Albumin: 4.4 g/dL (ref 3.9–4.9)
Alkaline Phosphatase: 69 IU/L (ref 44–121)
BUN/Creatinine Ratio: 12 (ref 10–24)
BUN: 17 mg/dL (ref 8–27)
Bilirubin Total: 0.3 mg/dL (ref 0.0–1.2)
CO2: 21 mmol/L (ref 20–29)
Calcium: 9 mg/dL (ref 8.6–10.2)
Chloride: 100 mmol/L (ref 96–106)
Creatinine, Ser: 1.46 mg/dL — ABNORMAL HIGH (ref 0.76–1.27)
Globulin, Total: 2.4 g/dL (ref 1.5–4.5)
Glucose: 100 mg/dL — ABNORMAL HIGH (ref 70–99)
Potassium: 4.6 mmol/L (ref 3.5–5.2)
Sodium: 139 mmol/L (ref 134–144)
Total Protein: 6.8 g/dL (ref 6.0–8.5)
eGFR: 52 mL/min/1.73 — ABNORMAL LOW

## 2022-10-31 LAB — LIPID PANEL
Chol/HDL Ratio: 3 ratio (ref 0.0–5.0)
Cholesterol, Total: 157 mg/dL (ref 100–199)
HDL: 53 mg/dL (ref >39)
LDL Chol Calc (NIH): 86 mg/dL (ref 0–99)
Triglycerides: 95 mg/dL (ref 0–149)
VLDL Cholesterol Cal: 18 mg/dL (ref 5–40)

## 2022-10-31 LAB — HEMOGLOBIN A1C
Est. average glucose Bld gHb Est-mCnc: 148 mg/dL
Hgb A1c MFr Bld: 6.8 % — ABNORMAL HIGH (ref 4.8–5.6)

## 2022-10-31 NOTE — Progress Notes (Signed)
Please let patient know that his lab work shows that his A1c increased some.  I recommend decreasing sweets in his diet.  If it goes up again we will have to consider starting medication.  Otherwise, his lab work looks good.  No concerns at this time.

## 2022-12-04 ENCOUNTER — Ambulatory Visit (INDEPENDENT_AMBULATORY_CARE_PROVIDER_SITE_OTHER): Payer: HMO | Admitting: Nurse Practitioner

## 2022-12-04 ENCOUNTER — Ambulatory Visit
Admission: RE | Admit: 2022-12-04 | Discharge: 2022-12-04 | Disposition: A | Discharge status: Home / Self Care | Payer: HMO | Source: Ambulatory Visit | Attending: Nurse Practitioner | Admitting: Nurse Practitioner

## 2022-12-04 ENCOUNTER — Encounter: Payer: Self-pay | Admitting: Nurse Practitioner

## 2022-12-04 ENCOUNTER — Ambulatory Visit
Admission: RE | Admit: 2022-12-04 | Discharge: 2022-12-04 | Disposition: A | Discharge status: Home / Self Care | Payer: HMO | Attending: Nurse Practitioner | Admitting: Nurse Practitioner

## 2022-12-04 VITALS — BP 106/69 | HR 76 | Temp 98.0°F | Wt 157.8 lb

## 2022-12-04 DIAGNOSIS — R0602 Shortness of breath: Secondary | ICD-10-CM | POA: Diagnosis not present

## 2022-12-04 DIAGNOSIS — J441 Chronic obstructive pulmonary disease with (acute) exacerbation: Secondary | ICD-10-CM | POA: Diagnosis not present

## 2022-12-04 DIAGNOSIS — R051 Acute cough: Secondary | ICD-10-CM

## 2022-12-04 DIAGNOSIS — R059 Cough, unspecified: Secondary | ICD-10-CM | POA: Diagnosis not present

## 2022-12-04 DIAGNOSIS — R062 Wheezing: Secondary | ICD-10-CM

## 2022-12-04 DIAGNOSIS — J439 Emphysema, unspecified: Secondary | ICD-10-CM | POA: Diagnosis not present

## 2022-12-04 MED ORDER — PREDNISONE 10 MG PO TABS: 10.0000 mg | ORAL_TABLET | Freq: Every day | ORAL | 0 refills | Status: DC

## 2022-12-04 MED ORDER — IPRATROPIUM-ALBUTEROL 0.5-2.5 (3) MG/3ML IN SOLN
3.0000 mL | Freq: Once | RESPIRATORY_TRACT | Status: AC
  Administered 2022-12-04: 3 mL via RESPIRATORY_TRACT

## 2022-12-04 MED ORDER — AMOXICILLIN 500 MG PO CAPS: 500.0000 mg | ORAL_CAPSULE | Freq: Two times a day (BID) | ORAL | 0 refills | Status: DC

## 2022-12-04 NOTE — Progress Notes (Signed)
BP 106/69   Pulse 76   Temp 98 F (36.7 C) (Oral)   Wt 157 lb 12.8 oz (71.6 kg)   SpO2 95%   BMI 25.47 kg/m    Subjective:    Patient ID: Terry French, male    DOB: January 21, 1951, 72 y.o.   MRN: [id number]  HPI: Terry French is a 72 y.o. male  Chief Complaint  Patient presents with   Cough    Pt states he has been having a cough and congestion for the past 2 weeks. States he has been coughing so hard that he can't catch his breath.    UPPER RESPIRATORY TRACT INFECTION Worst symptom: symptoms have been ongoing about 3 weeks Fever: no Cough: yes Shortness of breath: yes Wheezing: yes Chest pain: yes Chest tightness: yes Chest congestion: yes Nasal congestion: yes Runny nose: yes Post nasal drip: yes Sneezing: no Sore throat: no Swollen glands: no Sinus pressure: yes Headache: no Face pain: no Toothache: no Ear pain: no bilateral Ear pressure: no bilateral Eyes red/itching:no Eye drainage/crusting: no  Vomiting: no Rash: no Fatigue: yes Sick contacts: yes Strep contacts: no  Context: worse Recurrent sinusitis: no Relief with OTC cold/cough medications: no  Treatments attempted: none    Relevant past medical, surgical, family and social history reviewed and updated as indicated. Interim medical history since our last visit reviewed. Allergies and medications reviewed and updated.  Review of Systems  Constitutional:  Positive for fatigue. Negative for fever.  HENT:  Positive for congestion, postnasal drip, rhinorrhea and sinus pressure. Negative for ear pain, sinus pain, sneezing and sore throat.   Respiratory:  Positive for cough, chest tightness, shortness of breath and wheezing.   Gastrointestinal:  Negative for vomiting.  Skin:  Negative for rash.  Neurological:  Negative for headaches.    Per HPI unless specifically indicated above     Objective:    BP 106/69   Pulse 76   Temp 98 F (36.7 C) (Oral)   Wt 157 lb 12.8 oz (71.6 kg)   SpO2 95%    BMI 25.47 kg/m   Wt Readings from Last 3 Encounters:  12/04/22 157 lb 12.8 oz (71.6 kg)  10/30/22 156 lb 9.6 oz (71 kg)  09/28/22 160 lb 9.6 oz (72.8 kg)    Physical Exam Vitals and nursing note reviewed.  Constitutional:      General: He is not in acute distress.    Appearance: Normal appearance. He is not ill-appearing, toxic-appearing or diaphoretic.  HENT:     Head: Normocephalic.     Right Ear: Tympanic membrane and external ear normal.     Left Ear: Tympanic membrane and external ear normal.     Nose: Congestion and rhinorrhea present.     Mouth/Throat:     Mouth: Mucous membranes are moist.     Pharynx: Posterior oropharyngeal erythema present.  Eyes:     General:        Right eye: No discharge.        Left eye: No discharge.     Extraocular Movements: Extraocular movements intact.     Conjunctiva/sclera: Conjunctivae normal.     Pupils: Pupils are equal, round, and reactive to light.  Cardiovascular:     Rate and Rhythm: Normal rate and regular rhythm.     Heart sounds: No murmur heard. Pulmonary:     Effort: Pulmonary effort is normal. No respiratory distress.     Breath sounds: Decreased air movement present. Examination of  the right-upper field reveals wheezing. Examination of the left-upper field reveals decreased breath sounds. Examination of the right-middle field reveals wheezing. Examination of the left-middle field reveals decreased breath sounds. Examination of the right-lower field reveals wheezing. Examination of the left-lower field reveals decreased breath sounds. Decreased breath sounds and wheezing present. No rhonchi or rales.  Abdominal:     General: Abdomen is flat. Bowel sounds are normal.  Musculoskeletal:     Cervical back: Normal range of motion and neck supple.  Skin:    General: Skin is warm and dry.     Capillary Refill: Capillary refill takes less than 2 seconds.  Neurological:     General: No focal deficit present.     Mental Status: He is  alert and oriented to person, place, and time.  Psychiatric:        Mood and Affect: Mood normal.        Behavior: Behavior normal.        Thought Content: Thought content normal.        Judgment: Judgment normal.     Results for orders placed or performed in visit on 10/30/22  Comp Met (CMET)  Result Value Ref Range   Glucose 100 (H) 70 - 99 mg/dL   BUN 17 8 - 27 mg/dL   Creatinine, Ser 1.46 (H) 0.76 - 1.27 mg/dL   eGFR 52 (L) >59 mL/min/1.73   BUN/Creatinine Ratio 12 10 - 24   Sodium 139 134 - 144 mmol/L   Potassium 4.6 3.5 - 5.2 mmol/L   Chloride 100 96 - 106 mmol/L   CO2 21 20 - 29 mmol/L   Calcium 9.0 8.6 - 10.2 mg/dL   Total Protein 6.8 6.0 - 8.5 g/dL   Albumin 4.4 3.9 - 4.9 g/dL   Globulin, Total 2.4 1.5 - 4.5 g/dL   Albumin/Globulin Ratio 1.8 1.2 - 2.2   Bilirubin Total 0.3 0.0 - 1.2 mg/dL   Alkaline Phosphatase 69 44 - 121 IU/L   AST 20 0 - 40 IU/L   ALT 17 0 - 44 IU/L  Lipid Profile  Result Value Ref Range   Cholesterol, Total 157 100 - 199 mg/dL   Triglycerides 95 0 - 149 mg/dL   HDL 53 >39 mg/dL   VLDL Cholesterol Cal 18 5 - 40 mg/dL   LDL Chol Calc (NIH) 86 0 - 99 mg/dL   Chol/HDL Ratio 3.0 0.0 - 5.0 ratio  HgB A1c  Result Value Ref Range   Hgb A1c MFr Bld 6.8 (H) 4.8 - 5.6 %   Est. average glucose Bld gHb Est-mCnc 148 mg/dL  Microalbumin, Urine Waived  Result Value Ref Range   Microalb, Ur Waived 30 (H) 0 - 19 mg/L   Creatinine, Urine Waived 100 10 - 300 mg/dL   Microalb/Creat Ratio <30 <30 mg/g      Assessment & Plan:   Problem List Items Addressed This Visit   None Visit Diagnoses     COPD exacerbation ([place])    -  Primary   Obtain chest xray. Duo neb given in office. Complete course of amoxicillin and prednisone.  Follow up in 1 week, call sooner if concerns arise.   Relevant Medications   BREZTRI AEROSPHERE 160-9-4.8 MCG/ACT AERO   ipratropium-albuterol (DUONEB) 0.5-2.5 (3) MG/3ML nebulizer solution 3 mL (Start on 12/04/2022  2:00 PM)    predniSONE (DELTASONE) 10 MG tablet   Acute cough       Relevant Orders   DG Chest 2 View  Wheezing       Relevant Medications   ipratropium-albuterol (DUONEB) 0.5-2.5 (3) MG/3ML nebulizer solution 3 mL (Start on 12/04/2022  2:00 PM)        Follow up plan: Return in about 1 week (around 12/11/2022) for Lung check.

## 2022-12-06 NOTE — Progress Notes (Signed)
Please let patient know there is no evidence of pneumonia.  Continue with plan as discussed during the visit.

## 2022-12-14 ENCOUNTER — Encounter: Payer: Self-pay | Admitting: Nurse Practitioner

## 2022-12-14 ENCOUNTER — Ambulatory Visit (INDEPENDENT_AMBULATORY_CARE_PROVIDER_SITE_OTHER): Payer: HMO | Admitting: Nurse Practitioner

## 2022-12-14 VITALS — BP 129/78 | HR 71 | Temp 97.9°F | Wt 161.4 lb

## 2022-12-14 DIAGNOSIS — J439 Emphysema, unspecified: Secondary | ICD-10-CM | POA: Diagnosis not present

## 2022-12-14 MED ORDER — ALBUTEROL SULFATE HFA 108 (90 BASE) MCG/ACT IN AERS: 1.0000 | INHALATION_SPRAY | RESPIRATORY_TRACT | 1 refills | Status: DC | PRN

## 2022-12-14 MED ORDER — BREZTRI AEROSPHERE 160-9-4.8 MCG/ACT IN AERO: 2.0000 | INHALATION_SPRAY | Freq: Two times a day (BID) | RESPIRATORY_TRACT | 1 refills | Status: DC

## 2022-12-14 MED ORDER — ROSUVASTATIN CALCIUM 10 MG PO TABS: 10.0000 mg | ORAL_TABLET | Freq: Every day | ORAL | 1 refills | Status: DC

## 2022-12-14 NOTE — Progress Notes (Signed)
BP 129/78 (BP Location: Left Arm, Cuff Size: Normal)   Pulse 71   Temp 97.9 F (36.6 C) (Oral)   Wt 161 lb 6.4 oz (73.2 kg)   SpO2 97%   BMI 26.05 kg/m    Subjective:    Patient ID: Terry French, male    DOB: [birth year], 72 y.o.   MRN: [id number]  HPI: Terry French is a 72 y.o. male  Chief Complaint  Patient presents with   COPD   UPPER RESPIRATORY TRACT INFECTION Here for a lung check.  Patient states he is doing a lot better.  Feels like he is able to breath again.  Denies concerns at visit today.  He does still cough and have SOB.    Relevant past medical, surgical, family and social history reviewed and updated as indicated. Interim medical history since our last visit reviewed. Allergies and medications reviewed and updated.  Review of Systems  Respiratory:  Positive for cough and shortness of breath.     Per HPI unless specifically indicated above     Objective:    BP 129/78 (BP Location: Left Arm, Cuff Size: Normal)   Pulse 71   Temp 97.9 F (36.6 C) (Oral)   Wt 161 lb 6.4 oz (73.2 kg)   SpO2 97%   BMI 26.05 kg/m   Wt Readings from Last 3 Encounters:  12/14/22 161 lb 6.4 oz (73.2 kg)  12/04/22 157 lb 12.8 oz (71.6 kg)  10/30/22 156 lb 9.6 oz (71 kg)    Physical Exam Vitals and nursing note reviewed.  Constitutional:      General: He is not in acute distress.    Appearance: Normal appearance. He is not ill-appearing, toxic-appearing or diaphoretic.  HENT:     Head: Normocephalic.     Right Ear: External ear normal.     Left Ear: External ear normal.     Nose: Nose normal. No congestion or rhinorrhea.     Mouth/Throat:     Mouth: Mucous membranes are moist.  Eyes:     General:        Right eye: No discharge.        Left eye: No discharge.     Extraocular Movements: Extraocular movements intact.     Conjunctiva/sclera: Conjunctivae normal.     Pupils: Pupils are equal, round, and reactive to light.  Cardiovascular:     Rate and Rhythm:  Normal rate and regular rhythm.     Heart sounds: No murmur heard. Pulmonary:     Effort: Pulmonary effort is normal. No respiratory distress.     Breath sounds: Wheezing present. No rhonchi or rales.  Abdominal:     General: Abdomen is flat. Bowel sounds are normal.  Musculoskeletal:     Cervical back: Normal range of motion and neck supple.  Skin:    General: Skin is warm and dry.     Capillary Refill: Capillary refill takes less than 2 seconds.  Neurological:     General: No focal deficit present.     Mental Status: He is alert and oriented to person, place, and time.  Psychiatric:        Mood and Affect: Mood normal.        Behavior: Behavior normal.        Thought Content: Thought content normal.        Judgment: Judgment normal.     Results for orders placed or performed in visit on 10/30/22  Comp Met (CMET)  Result Value Ref Range   Glucose 100 (H) 70 - 99 mg/dL   BUN 17 8 - 27 mg/dL   Creatinine, Ser 1.46 (H) 0.76 - 1.27 mg/dL   eGFR 52 (L) >59 mL/min/1.73   BUN/Creatinine Ratio 12 10 - 24   Sodium 139 134 - 144 mmol/L   Potassium 4.6 3.5 - 5.2 mmol/L   Chloride 100 96 - 106 mmol/L   CO2 21 20 - 29 mmol/L   Calcium 9.0 8.6 - 10.2 mg/dL   Total Protein 6.8 6.0 - 8.5 g/dL   Albumin 4.4 3.9 - 4.9 g/dL   Globulin, Total 2.4 1.5 - 4.5 g/dL   Albumin/Globulin Ratio 1.8 1.2 - 2.2   Bilirubin Total 0.3 0.0 - 1.2 mg/dL   Alkaline Phosphatase 69 44 - 121 IU/L   AST 20 0 - 40 IU/L   ALT 17 0 - 44 IU/L  Lipid Profile  Result Value Ref Range   Cholesterol, Total 157 100 - 199 mg/dL   Triglycerides 95 0 - 149 mg/dL   HDL 53 >39 mg/dL   VLDL Cholesterol Cal 18 5 - 40 mg/dL   LDL Chol Calc (NIH) 86 0 - 99 mg/dL   Chol/HDL Ratio 3.0 0.0 - 5.0 ratio  HgB A1c  Result Value Ref Range   Hgb A1c MFr Bld 6.8 (H) 4.8 - 5.6 %   Est. average glucose Bld gHb Est-mCnc 148 mg/dL  Microalbumin, Urine Waived  Result Value Ref Range   Microalb, Ur Waived 30 (H) 0 - 19 mg/L    Creatinine, Urine Waived 100 10 - 300 mg/dL   Microalb/Creat Ratio <30 <30 mg/g      Assessment & Plan:   Problem List Items Addressed This Visit       Respiratory   Emphysema lung ([place]) - Primary   Relevant Medications   albuterol (VENTOLIN HFA) 108 (90 Base) MCG/ACT inhaler   BREZTRI AEROSPHERE 160-9-4.8 MCG/ACT AERO     Follow up plan: Return if symptoms worsen or fail to improve.

## 2022-12-20 ENCOUNTER — Encounter: Payer: Self-pay | Admitting: Nurse Practitioner

## 2023-02-11 ENCOUNTER — Other Ambulatory Visit: Payer: Self-pay | Admitting: Nurse Practitioner

## 2023-02-12 NOTE — Telephone Encounter (Signed)
Requested by interface surescripts. Medication discontinued course completed 12/14/22. Requested Prescriptions  Refused Prescriptions Disp Refills   amoxicillin (AMOXIL) 500 MG capsule [Pharmacy Med Name: AMOXICILLIN 500 MG CAPSULE] 20 capsule 0    Sig: TAKE 1 CAPSULE BY MOUTH TWICE A DAY FOR 10 DAYS     Off-Protocol Failed - 02/11/2023 11:53 AM      Failed - Medication not assigned to a protocol, review manually.      Passed - Valid encounter within last 12 months    Recent Outpatient Visits           2 months ago Pulmonary emphysema, unspecified emphysema type (HCC)   [place] [facility] Larae Grooms, NP   2 months ago COPD exacerbation [facility])   [place] [facility] Larae Grooms, NP   3 months ago Atherosclerosis of aorta [facility])   [place] [facility] Larae Grooms, NP   4 months ago Pulmonary emphysema, unspecified emphysema type [facility])   [place] [facility] Larae Grooms, NP   5 months ago COPD exacerbation [facility])   [place] [facility] Larae Grooms, NP       Future Appointments             In 1 month Larae Grooms, NP [place] [facility]

## 2023-03-07 ENCOUNTER — Other Ambulatory Visit: Payer: Self-pay | Admitting: Nurse Practitioner

## 2023-03-07 DIAGNOSIS — J439 Emphysema, unspecified: Secondary | ICD-10-CM

## 2023-03-07 NOTE — Telephone Encounter (Signed)
Requested Prescriptions  Pending Prescriptions Disp Refills   albuterol (VENTOLIN HFA) 108 (90 Base) MCG/ACT inhaler [Pharmacy Med Name: ALBUTEROL HFA 90 MCG INHALER] 18 g 1    Sig: INHALE 1-2 PUFFS INTO THE LUNGS EVERY 4 HOURS AS NEEDED FOR WHEEZING     Pulmonology:  Beta Agonists 2 Passed - 03/07/2023 11:20 AM      Passed - Last BP in normal range    BP Readings from Last 1 Encounters:  12/14/22 129/78         Passed - Last Heart Rate in normal range    Pulse Readings from Last 1 Encounters:  12/14/22 71         Passed - Valid encounter within last 12 months    Recent Outpatient Visits           2 months ago Pulmonary emphysema, unspecified emphysema type (HCC)   [place] [facility] Larae Grooms, NP   3 months ago COPD exacerbation [facility])   [place] [facility] Larae Grooms, NP   4 months ago Atherosclerosis of aorta [facility])   [place] [facility] Larae Grooms, NP   5 months ago Pulmonary emphysema, unspecified emphysema type [facility])   [place] [facility] Larae Grooms, NP   5 months ago COPD exacerbation [facility])   [place] [facility] Larae Grooms, NP       Future Appointments             In 3 weeks Larae Grooms, NP [facility]            '

## 2023-03-21 ENCOUNTER — Other Ambulatory Visit: Payer: Self-pay | Admitting: Nurse Practitioner

## 2023-03-21 DIAGNOSIS — J439 Emphysema, unspecified: Secondary | ICD-10-CM

## 2023-03-22 NOTE — Telephone Encounter (Signed)
Unable to refill per protocol, Rx request is too soon. Last refill 03/07/23 for 18 g and 1 refill.  Requested Prescriptions  Pending Prescriptions Disp Refills   albuterol (VENTOLIN HFA) 108 (90 Base) MCG/ACT inhaler [Pharmacy Med Name: ALBUTEROL HFA 90 MCG INHALER] 8.5 g     Sig: INHALE 1-2 PUFFS INTOTHE LUNGS EVERY 4 HOURS AS NEEDED FOR WHEEZING     Pulmonology:  Beta Agonists 2 Passed - 03/21/2023  2:29 PM      Passed - Last BP in normal range    BP Readings from Last 1 Encounters:  12/14/22 129/78         Passed - Last Heart Rate in normal range    Pulse Readings from Last 1 Encounters:  12/14/22 71         Passed - Valid encounter within last 12 months    Recent Outpatient Visits           3 months ago Pulmonary emphysema, unspecified emphysema type (HCC)   [place] [facility] Larae Grooms, NP   3 months ago COPD exacerbation [facility])   [place] [facility] Larae Grooms, NP   4 months ago Atherosclerosis of aorta [facility])   [facility] Larae Grooms, NP   5 months ago Pulmonary emphysema, unspecified emphysema type [facility])   [place] [facility] Larae Grooms, NP   6 months ago COPD exacerbation [facility])   [place] [facility] Larae Grooms, NP       Future Appointments             In 1 week Larae Grooms, NP [place] [facility]

## 2023-03-23 ENCOUNTER — Telehealth: Payer: Self-pay

## 2023-03-23 NOTE — Telephone Encounter (Signed)
PA for [facility] initiated and submitted via Cover My Meds. Key: [id number]

## 2023-04-02 ENCOUNTER — Ambulatory Visit: Payer: PPO | Admitting: Nurse Practitioner

## 2023-04-02 ENCOUNTER — Encounter: Payer: Self-pay | Admitting: Family Medicine

## 2023-04-02 ENCOUNTER — Ambulatory Visit (INDEPENDENT_AMBULATORY_CARE_PROVIDER_SITE_OTHER): Payer: HMO | Admitting: Family Medicine

## 2023-04-02 VITALS — BP 107/72 | HR 68 | Temp 97.8°F | Ht 66.0 in | Wt 154.2 lb

## 2023-04-02 DIAGNOSIS — E1122 Type 2 diabetes mellitus with diabetic chronic kidney disease: Secondary | ICD-10-CM

## 2023-04-02 DIAGNOSIS — N183 Chronic kidney disease, stage 3 unspecified: Secondary | ICD-10-CM | POA: Diagnosis not present

## 2023-04-02 DIAGNOSIS — J439 Emphysema, unspecified: Secondary | ICD-10-CM | POA: Diagnosis not present

## 2023-04-02 DIAGNOSIS — E785 Hyperlipidemia, unspecified: Secondary | ICD-10-CM | POA: Diagnosis not present

## 2023-04-02 DIAGNOSIS — E1169 Type 2 diabetes mellitus with other specified complication: Secondary | ICD-10-CM | POA: Diagnosis not present

## 2023-04-02 LAB — BAYER DCA HB A1C WAIVED: HB A1C (BAYER DCA - WAIVED): 6.5 % — ABNORMAL HIGH (ref 4.8–5.6)

## 2023-04-02 MED ORDER — BREZTRI AEROSPHERE 160-9-4.8 MCG/ACT IN AERO: 2.0000 | INHALATION_SPRAY | Freq: Two times a day (BID) | RESPIRATORY_TRACT | 2 refills | Status: DC

## 2023-04-02 MED ORDER — ALBUTEROL SULFATE HFA 108 (90 BASE) MCG/ACT IN AERS
INHALATION_SPRAY | RESPIRATORY_TRACT | 1 refills | Status: DC
Start: 2023-04-02 — End: 2023-08-30

## 2023-04-02 NOTE — Progress Notes (Addendum)
BP 107/72   Pulse 68   Temp 97.8 F (36.6 C) (Oral)   Ht 5\' 6"  (1.676 m)   Wt 154 lb 3.2 oz (69.9 kg)   SpO2 96%   BMI 24.89 kg/m    Subjective:    Patient ID: Terry French, male    DOB: 05-31-1951, 72 y.o.   MRN: [id number]  HPI: Terry French is a 72 y.o. male  Chief Complaint  Patient presents with   Hypertension   Hyperlipidemia   Diabetes    Patient declined having an recent Diabetic Eye Exam.    He is a current tobacco user. Smoking cessation provided for 5 minutes, pt refused to quit smoking at this time. He admits to falling on gravel a month ago, did not lose consciousness, got a scrape on his forehead, denies loss of bodily function, pain, or weakness.   DIABETES He is diet controlled, not currently on medication regimen. He denies consuming fruits and vegetables daily, has cut back on sugary snacks. He is drinking x 4 water bottles daily and walking .25 miles to the mailbox daily, which takes him 30 minutes.   Hypoglycemic episodes:no Polydipsia/polyuria: no Visual disturbance: no Chest pain: no Paresthesias: no Glucose Monitoring: no  Accucheck frequency: Not Checking Taking Insulin?: no Retinal Examination: Need to schedule with eye doctor Foot Exam:  Refused in office today. Diabetic Education: Completed Pneumovax: Up to Date Influenza: Up to Date Aspirin: no   HYPERLIPIDEMIA He is taking Crestor 10 mg daily Hyperlipidemia status: excellent compliance Satisfied with current treatment?  yes Side effects:  no Medication compliance: excellent compliance Supplements: none Aspirin:  no The 10-year ASCVD risk score (Arnett DK, et al., 2019) is: 24.4%   Values used to calculate the score:     Age: 89 years     Sex: Male     Is Non-Hispanic African American: No     Diabetic: Yes     Tobacco smoker: Yes     Systolic Blood Pressure: 107 mmHg     Is BP treated: No     HDL Cholesterol: 53 mg/dL     Total Cholesterol: 157 mg/dL Chest pain:   no Coronary artery disease:  yes Family history CAD:  yes Family history early CAD:  yes   Relevant past medical, surgical, family and social history reviewed and updated as indicated. Interim medical history since our last visit reviewed. Allergies and medications reviewed and updated.  Review of Systems  Constitutional: Negative.   Eyes: Negative.  Negative for visual disturbance.  Respiratory: Negative.    Cardiovascular:  Negative for chest pain.  Endocrine: Negative.  Negative for polydipsia, polyphagia and polyuria.  Genitourinary: Negative.  Negative for frequency.  Neurological:  Negative for numbness.    Per HPI unless specifically indicated above     Objective:    BP 107/72   Pulse 68   Temp 97.8 F (36.6 C) (Oral)   Ht 5\' 6"  (1.676 m)   Wt 154 lb 3.2 oz (69.9 kg)   SpO2 96%   BMI 24.89 kg/m   Wt Readings from Last 3 Encounters:  04/02/23 154 lb 3.2 oz (69.9 kg)  12/14/22 161 lb 6.4 oz (73.2 kg)  12/04/22 157 lb 12.8 oz (71.6 kg)    Physical Exam Vitals and nursing note reviewed.  Constitutional:      General: He is not in acute distress.    Appearance: Normal appearance. He is not ill-appearing, toxic-appearing or diaphoretic.  HENT:  Head: Normocephalic.     Right Ear: External ear normal.     Left Ear: External ear normal.     Nose: Nose normal. No congestion or rhinorrhea.     Mouth/Throat:     Mouth: Mucous membranes are moist.  Eyes:     General:        Right eye: No discharge.        Left eye: No discharge.     Extraocular Movements: Extraocular movements intact.     Conjunctiva/sclera: Conjunctivae normal.     Pupils: Pupils are equal, round, and reactive to light.  Cardiovascular:     Rate and Rhythm: Normal rate and regular rhythm.     Pulses:          Radial pulses are 2+ on the right side and 2+ on the left side.       Posterior tibial pulses are 2+ on the right side and 2+ on the left side.     Heart sounds: Normal heart sounds,  S1 normal and S2 normal. No murmur heard. Pulmonary:     Effort: Pulmonary effort is normal. No respiratory distress.     Breath sounds: No wheezing, rhonchi or rales.  Abdominal:     General: Abdomen is flat. Bowel sounds are normal.  Musculoskeletal:     Cervical back: Normal range of motion and neck supple.  Skin:    General: Skin is warm and dry.     Capillary Refill: Capillary refill takes less than 2 seconds.  Neurological:     General: No focal deficit present.     Mental Status: He is alert and oriented to person, place, and time.  Psychiatric:        Mood and Affect: Mood normal.        Behavior: Behavior normal.        Thought Content: Thought content normal.        Judgment: Judgment normal.     Results for orders placed or performed in visit on 10/30/22  Comp Met (CMET)  Result Value Ref Range   Glucose 100 (H) 70 - 99 mg/dL   BUN 17 8 - 27 mg/dL   Creatinine, Ser 6.04 (H) 0.76 - 1.27 mg/dL   eGFR 52 (L) >54 UJ/WJX/9.14   BUN/Creatinine Ratio 12 10 - 24   Sodium 139 134 - 144 mmol/L   Potassium 4.6 3.5 - 5.2 mmol/L   Chloride 100 96 - 106 mmol/L   CO2 21 20 - 29 mmol/L   Calcium 9.0 8.6 - 10.2 mg/dL   Total Protein 6.8 6.0 - 8.5 g/dL   Albumin 4.4 3.9 - 4.9 g/dL   Globulin, Total 2.4 1.5 - 4.5 g/dL   Albumin/Globulin Ratio 1.8 1.2 - 2.2   Bilirubin Total 0.3 0.0 - 1.2 mg/dL   Alkaline Phosphatase 69 44 - 121 IU/L   AST 20 0 - 40 IU/L   ALT 17 0 - 44 IU/L  Lipid Profile  Result Value Ref Range   Cholesterol, Total 157 100 - 199 mg/dL   Triglycerides 95 0 - 149 mg/dL   HDL 53 >78 mg/dL   VLDL Cholesterol Cal 18 5 - 40 mg/dL   LDL Chol Calc (NIH) 86 0 - 99 mg/dL   Chol/HDL Ratio 3.0 0.0 - 5.0 ratio  HgB A1c  Result Value Ref Range   Hgb A1c MFr Bld 6.8 (H) 4.8 - 5.6 %   Est. average glucose Bld gHb Est-mCnc 148 mg/dL  Microalbumin,  Urine Waived  Result Value Ref Range   Microalb, Ur Waived 30 (H) 0 - 19 mg/L   Creatinine, Urine Waived 100 10 - 300  mg/dL   Microalb/Creat Ratio <30 <30 mg/g      Assessment & Plan:   Problem List Items Addressed This Visit     Emphysema lung (HCC)    Chronic, ongoing. Continue taking Breztri daily and albuterol PRN. Refills given today. Referral for lung cancer screening done.       Relevant Medications   BREZTRI AEROSPHERE 160-9-4.8 MCG/ACT AERO   albuterol (VENTOLIN HFA) 108 (90 Base) MCG/ACT inhaler   Other Relevant Orders   Ambulatory Referral Lung Cancer Screening [facility]   Diabetes type 2, controlled (HCC)    Chronic, ongoing. A1C 6.5 today. Diet controlled, not currently on medication regimen. Continue heart healthy diet and exercise of 150 mins weekly. Refused foot exam today. Recommend scheduling eye exam with ophthalmologist.       Relevant Orders   Bayer DCA Hb A1c Waived   Comp Met (CMET)   Other Visit Diagnoses     Hyperlipidemia associated with type 2 diabetes mellitus (HCC)    -  Primary   Chronic, stable. Lipid panel done today. Continue taking Crestor 10mg  daily, refill given, recommend following heart healthy diet with 150 mins weekly exercise.   Relevant Orders   Lipid Profile   Comp Met (CMET)        Follow up plan: Return in about 6 months (around 10/02/2023) for DM2/HLD, COPD, DM foot exam, tetanus vaccine.

## 2023-04-02 NOTE — Assessment & Plan Note (Addendum)
Chronic, ongoing. A1C 6.5 today. Diet controlled, not currently on medication regimen. Continue heart healthy diet and exercise of 150 mins weekly. Refused foot exam today. Recommend scheduling eye exam with ophthalmologist.

## 2023-04-02 NOTE — Assessment & Plan Note (Signed)
Chronic, ongoing. Continue taking Breztri daily and albuterol PRN. Refills given today. Referral for lung cancer screening done.

## 2023-04-02 NOTE — Patient Instructions (Signed)
Schedule with eye doctor for eye exam

## 2023-04-03 ENCOUNTER — Ambulatory Visit: Payer: HMO | Admitting: Nurse Practitioner

## 2023-04-03 ENCOUNTER — Other Ambulatory Visit: Payer: Self-pay | Admitting: Nurse Practitioner

## 2023-04-03 LAB — COMPREHENSIVE METABOLIC PANEL
ALT: 13 IU/L (ref 0–44)
AST: 18 IU/L (ref 0–40)
Albumin: 4.3 g/dL (ref 3.9–4.9)
Alkaline Phosphatase: 68 IU/L (ref 44–121)
BUN/Creatinine Ratio: 13 (ref 10–24)
BUN: 19 mg/dL (ref 8–27)
Bilirubin Total: <0.2 mg/dL (ref 0.0–1.2)
CO2: 21 mmol/L (ref 20–29)
Calcium: 9.2 mg/dL (ref 8.6–10.2)
Chloride: 101 mmol/L (ref 96–106)
Creatinine, Ser: 1.44 mg/dL — ABNORMAL HIGH (ref 0.76–1.27)
Globulin, Total: 2.7 g/dL (ref 1.5–4.5)
Glucose: 105 mg/dL — ABNORMAL HIGH (ref 70–99)
Potassium: 4.7 mmol/L (ref 3.5–5.2)
Sodium: 136 mmol/L (ref 134–144)
Total Protein: 7 g/dL (ref 6.0–8.5)
eGFR: 53 mL/min/{1.73_m2} — ABNORMAL LOW (ref >59)

## 2023-04-03 LAB — LIPID PANEL
Chol/HDL Ratio: 3.3 ratio (ref 0.0–5.0)
Cholesterol, Total: 147 mg/dL (ref 100–199)
HDL: 44 mg/dL (ref >39)
LDL Chol Calc (NIH): 75 mg/dL (ref 0–99)
Triglycerides: 166 mg/dL — ABNORMAL HIGH (ref 0–149)
VLDL Cholesterol Cal: 28 mg/dL (ref 5–40)

## 2023-04-03 NOTE — Telephone Encounter (Signed)
Duplicate request- filled 04/02/23 Requested Prescriptions  Pending Prescriptions Disp Refills   BREZTRI AEROSPHERE 160-9-4.8 MCG/ACT AERO [Pharmacy Med Name: BREZTRI AEROSPHERE INHALER] 10.7 g 2    Sig: INHALE 2 PUFFS BY MOUTH TWICE A DAY IN THE MORNING AND IN THE EVENING     Off-Protocol Failed - 04/03/2023  6:22 AM      Failed - Medication not assigned to a protocol, review manually.      Passed - Valid encounter within last 12 months    Recent Outpatient Visits           Yesterday Hyperlipidemia associated with type 2 diabetes mellitus (HCC)   [place] [facility] Pearley, Sherran Needs, NP   3 months ago Pulmonary emphysema, unspecified emphysema type [facility])   [place] [facility] Larae Grooms, NP   4 months ago COPD exacerbation [facility])   [place] [facility] Larae Grooms, NP   5 months ago Atherosclerosis of aorta [facility])   [place] [facility] Lar[facility] Grooms, NP   6 months ago Pulmonary emphysema, unspecified emphysema type [facility])   [place] [facility] Larae Grooms, NP       Future Appointments             In 6 months Larae Grooms, NP  [facility]

## 2023-04-03 NOTE — Progress Notes (Signed)
Hi Terry French, your electrolyte and liver function results came back normal. However your kidney function levels remain elevated, with a mild improvement since the last time we checked them back in January. I recommend drinking plenty of water daily, staying hydrated, limiting sodium in your diet, and monitoring your BP levels. There was also an elevation in your triglycerides, this could be due to the fact that you were not fasting when we took them, we will check your lipids again at your next visit with plans of fasting. If the visit is late in the afternoon we can plan to have you come early the day before your visit and we can draw labs then. Thank you for allowing me to participate in your care.

## 2023-04-25 ENCOUNTER — Telehealth: Payer: Self-pay | Admitting: Nurse Practitioner

## 2023-04-25 NOTE — Telephone Encounter (Signed)
Copied from CRM [id number]. Topic: Medicare AWV >> Apr 25, 2023  9:26 AM Payton Doughty wrote: Reason for CRM: LVM 04/25/23 to r/s AWV appt. New appt date 05/01/23@ 1pm. Please confirm appt date change [facility]  [name]; Care Guide Ambulatory Clinical Support [place] [facility] Direct Dial: [phone]

## 2023-05-01 ENCOUNTER — Ambulatory Visit (INDEPENDENT_AMBULATORY_CARE_PROVIDER_SITE_OTHER): Payer: HMO

## 2023-05-01 VITALS — Ht 66.0 in | Wt 154.0 lb

## 2023-05-01 DIAGNOSIS — Z0000 Encounter for general adult medical examination without abnormal findings: Secondary | ICD-10-CM

## 2023-05-01 NOTE — Progress Notes (Signed)
Subjective:   Terry French is a 72 y.o. male who presents for Medicare Annual/Subsequent preventive examination.  Per patient no change in vitals since last visit; unable to obtain new vitals due to this being a telehealth visit. 05/01/23 Patient was unable to self-report vital signs via telehealth due to a lack of equipment at home.   Visit Complete: Virtual  I connected with  Terry French on 05/01/23 by a audio enabled telemedicine application and verified that I am speaking with the correct person using two identifiers.  Patient Location: Home  Provider Location: Office/Clinic  I discussed the limitations of evaluation and management by telemedicine. The patient expressed understanding and agreed to proceed.  Review of Systems     Cardiac Risk Factors include: advanced age (>67men, >52 women);male gender;sedentary lifestyle     Objective:    Today's Vitals   05/01/23 1318  Weight: 154 lb (69.9 kg)  Height: 5\' 6"  (1.676 m)   Body mass index is 24.86 kg/m.     05/01/2023    1:07 PM 07/05/2022    8:01 AM 04/25/2022   12:23 PM 01/14/2021   11:27 AM  Advanced Directives  Does Patient Have a Medical Advance Directive? No No No No  Would patient like information on creating a medical advance directive? No - Patient declined No - Patient declined No - Patient declined No - Patient declined    Current Medications (verified) Outpatient Encounter Medications as of 05/01/2023  Medication Sig   albuterol (VENTOLIN HFA) 108 (90 Base) MCG/ACT inhaler INHALE 1-2 PUFFS INTO THE LUNGS EVERY 4 HOURS AS NEEDED FOR WHEEZING   BREZTRI AEROSPHERE 160-9-4.8 MCG/ACT AERO Inhale 2 puffs into the lungs in the morning and at bedtime.   rosuvastatin (CRESTOR) 10 MG tablet Take 1 tablet (10 mg total) by mouth daily.   No facility-administered encounter medications on file as of 05/01/2023.    Allergies (verified) Patient has no known allergies.   History: Past Medical History:  Diagnosis  Date   Asthma    COPD (chronic obstructive pulmonary disease) (HCC)    Emphysema lung (HCC)    Past Surgical History:  Procedure Laterality Date   BACK SURGERY     COLONOSCOPY WITH PROPOFOL N/A 07/05/2022   Procedure: COLONOSCOPY WITH PROPOFOL;  Surgeon: Toney Reil, MD;  [facility] ENDOSCOPY;  Service: Gastroenterology;  Laterality: N/A;   Family History  Problem Relation Age of Onset   Cancer Mother    COPD Father    Heart attack Brother    Diabetes Brother    Social History   Socioeconomic History   Marital status: Divorced    Spouse name: Not on file   Number of children: Not on file   Years of education: Not on file   Highest education level: Not on file  Occupational History   Not on file  Tobacco Use   Smoking status: Every Day    Current packs/day: 1.00    Average packs/day: 1 pack/day for 44.0 years (44.0 ttl pk-yrs)    Types: Cigarettes   Smokeless tobacco: Never  Vaping Use   Vaping status: Never Used  Substance and Sexual Activity   Alcohol use: No    Comment: former alcoholic   Drug use: Yes    Types: Marijuana    Comment: cocaine use in the past   Sexual activity: Yes    Birth control/protection: None, Condom  Other Topics Concern   Not on file  Social History Narrative  Not on file   Social Determinants of Health   Financial Resource Strain: Low Risk  (05/01/2023)   Overall Financial Resource Strain (CARDIA)    Difficulty of Paying Living Expenses: Not hard at all  Food Insecurity: No Food Insecurity (05/01/2023)   Hunger Vital Sign    Worried About Running Out of Food in the Last Year: Never true    Ran Out of Food in the Last Year: Never true  Transportation Needs: No Transportation Needs (05/01/2023)   PRAPARE - Administrator, Civil Service (Medical): No    Lack of Transportation (Non-Medical): No  Physical Activity: Sufficiently Active (05/01/2023)   Exercise Vital Sign    Days of Exercise per Week: 7 days     Minutes of Exercise per Session: 30 min  Stress: No Stress Concern Present (05/01/2023)   [employer] of Occupational Health - Occupational Stress Questionnaire    Feeling of Stress : Only a little  Social Connections: Socially Isolated (05/01/2023)   Social Connection and Isolation Panel [NHANES]    Frequency of Communication with Friends and Family: Never    Frequency of Social Gatherings with Friends and Family: More than three times a week    Attends Religious Services: Never    Database administrator or Organizations: No    Attends Engineer, structural: Never    Marital Status: Divorced    Tobacco Counseling Ready to quit: Not Answered Counseling given: Not Answered   Clinical Intake:  Pre-visit preparation completed: Yes  Pain : No/denies pain     Nutritional Risks: None Diabetes: No  How often do you need to have someone help you when you read instructions, pamphlets, or other written materials from your doctor or pharmacy?: 1 - Never  Interpreter Needed?: No  Information entered by :: Terry Bucker, LPN   Activities of Daily Living    05/01/2023    1:08 PM  In your present state of health, do you have any difficulty performing the following activities:  Hearing? 0  Vision? 0  Difficulty concentrating or making decisions? 0  Walking or climbing stairs? 0  Dressing or bathing? 0  Doing errands, shopping? 0  Preparing Food and eating ? N  Using the Toilet? N  In the past six months, have you accidently leaked urine? N  Do you have problems with loss of bowel control? N  Managing your Medications? N  Managing your Finances? N  Housekeeping or managing your Housekeeping? N    Patient Care Team: Larae Grooms, NP as PCP - General (Nurse Practitioner)  Indicate any recent Medical Services you may have received from other than [facility] providers in the past year (date may be approximate).     Assessment:   This is a routine wellness  examination for [place].  Hearing/Vision screen Hearing Screening - Comments:: No aids Vision Screening - Comments:: Readers-   Dietary issues and exercise activities discussed:     Goals Addressed             This Visit's Progress    DIET - EAT MORE FRUITS AND VEGETABLES         Depression Screen    05/01/2023    1:05 PM 12/04/2022    1:27 PM 09/14/2022    2:18 PM 07/06/2022    1:25 PM 04/25/2022   12:30 PM 04/05/2022    1:54 PM 02/13/2022   10:15 AM  PHQ 2/9 Scores  PHQ - 2 Score 0 5 1  2 0 3 4  PHQ- 9 Score 0 8 6 8 2 8 16     Fall Risk    05/01/2023    1:07 PM 10/30/2022    2:50 PM 09/14/2022    2:18 PM 07/06/2022    1:25 PM 04/25/2022   12:20 PM  Fall Risk   Falls in the past year? 1 0 1 1 0  Number falls in past yr: 0 0 1 1 0  Injury with Fall? 0 0 0 0 0  Risk for fall due to : History of fall(s) No Fall Risks History of fall(s) History of fall(s)   Follow up Falls evaluation completed;Falls prevention discussed Falls evaluation completed Falls evaluation completed Falls evaluation completed Falls evaluation completed;Education provided;Falls prevention discussed    MEDICARE RISK AT HOME:  Medicare Risk at Home - 05/01/23 1309     Any stairs in or around the home? Yes    If so, are there any without handrails? No    Home free of loose throw rugs in walkways, pet beds, electrical cords, etc? Yes    Adequate lighting in your home to reduce risk of falls? Yes    Life alert? No    Use of a cane, walker or w/c? No    Grab bars in the bathroom? No    Shower chair or bench in shower? No    Elevated toilet seat or a handicapped toilet? No             TIMED UP AND GO:  Was the test performed?  No    Cognitive Function:        05/01/2023    1:11 PM 04/25/2022   12:21 PM  6CIT Screen  What Year? 0 points 4 points  What month? 0 points 0 points  What time? 0 points 0 points  Count back from 20 0 points 4 points  Months in reverse 4 points 4 points  Repeat  phrase 8 points 2 points  Total Score 12 points 14 points    Immunizations Immunization History  Administered Date(s) Administered   Fluad Quad(high Dose 65+) 07/06/2022   PNEUMOCOCCAL CONJUGATE-20 04/05/2022    TDAP status: Due, Education has been provided regarding the importance of this vaccine. Advised may receive this vaccine at local pharmacy or Health Dept. Aware to provide a copy of the vaccination record if obtained from local pharmacy or Health Dept. Verbalized acceptance and understanding.  Flu Vaccine status: Up to date  Pneumococcal vaccine status: Up to date  Covid-19 vaccine status: Declined, Education has been provided regarding the importance of this vaccine but patient still declined. Advised may receive this vaccine at local pharmacy or Health Dept.or vaccine clinic. Aware to provide a copy of the vaccination record if obtained from local pharmacy or Health Dept. Verbalized acceptance and understanding.  Qualifies for Shingles Vaccine? Yes   Zostavax completed No   Shingrix Completed?: No.    Education has been provided regarding the importance of this vaccine. Patient has been advised to call insurance company to determine out of pocket expense if they have not yet received this vaccine. Advised may also receive vaccine at local pharmacy or Health Dept. Verbalized acceptance and understanding.  Screening Tests Health Maintenance  Topic Date Due   OPHTHALMOLOGY EXAM  Never done   DTaP/Tdap/Td (1 - Tdap) Never done   Lung Cancer Screening  Never done   Zoster Vaccines- Shingrix (1 of 2) Never done   COVID-19 Vaccine (1 - 2023-24  season) Never done   INFLUENZA VACCINE  05/10/2023   HEMOGLOBIN A1C  10/02/2023   Diabetic kidney evaluation - Urine ACR  10/31/2023   FOOT EXAM  10/31/2023   Diabetic kidney evaluation - eGFR measurement  04/01/2024   Medicare Annual Wellness (AWV)  04/30/2024   Colonoscopy  07/05/2024   Pneumonia Vaccine 51+ Years old  Completed    Hepatitis C Screening  Completed   HPV VACCINES  Aged Out    Health Maintenance  Health Maintenance Due  Topic Date Due   OPHTHALMOLOGY EXAM  Never done   DTaP/Tdap/Td (1 - Tdap) Never done   Lung Cancer Screening  Never done   Zoster Vaccines- Shingrix (1 of 2) Never done   COVID-19 Vaccine (1 - 2023-24 season) Never done    Colorectal cancer screening: Type of screening: Colonoscopy. Completed 07/05/22. Repeat every 2 years  Lung Cancer Screening: (Low Dose CT Chest recommended if Age 66-80 years, 20 pack-year currently smoking OR have quit w/in 15years.) does qualify.   Lung Cancer Screening Referral: ordered 04/02/23  Additional Screening:  Hepatitis C Screening: does qualify; Completed 07/06/22  Vision Screening: Recommended annual ophthalmology exams for early detection of glaucoma and other disorders of the eye. Is the patient up to date with their annual eye exam?  No  Who is the provider or what is the name of the office in which the patient attends annual eye exams? No one If pt is not established with a provider, would they like to be referred to a provider to establish care? No .   Dental Screening: Recommended annual dental exams for proper oral hygiene   Community Resource Referral / Chronic Care Management: CRR required this visit?  No   CCM required this visit?  No     Plan:     I have personally reviewed and noted the following in the patient's chart:   Medical and social history Use of alcohol, tobacco or illicit drugs  Current medications and supplements including opioid prescriptions. Patient is not currently taking opioid prescriptions. Functional ability and status Nutritional status Physical activity Advanced directives List of other physicians Hospitalizations, surgeries, and ER visits in previous 12 months Vitals Screenings to include cognitive, depression, and falls Referrals and appointments  In addition, I have reviewed and discussed  with patient certain preventive protocols, quality metrics, and best practice recommendations. A written personalized care plan for preventive services as well as general preventive health recommendations were provided to patient.     Terry Hope, LPN   1/61/0960   After Visit Summary: (MyChart) Due to this being a telephonic visit, the after visit summary with patients personalized plan was offered to patient via MyChart   Nurse Notes: none

## 2023-05-01 NOTE — Patient Instructions (Signed)
Terry French , Thank you for taking time to come for your Medicare Wellness Visit. I appreciate your ongoing commitment to your health goals. Please review the following plan we discussed and let me know if I can assist you in the future.   These are the goals we discussed:  Goals      DIET - EAT MORE FRUITS AND VEGETABLES     Patient Stated     No goals        This is a list of the screening recommended for you and due dates:  Health Maintenance  Topic Date Due   Eye exam for diabetics  Never done   DTaP/Tdap/Td vaccine (1 - Tdap) Never done   Screening for Lung Cancer  Never done   Zoster (Shingles) Vaccine (1 of 2) Never done   COVID-19 Vaccine (1 - 2023-24 season) Never done   Flu Shot  05/10/2023   Hemoglobin A1C  10/02/2023   Yearly kidney health urinalysis for diabetes  10/31/2023   Complete foot exam   10/31/2023   Yearly kidney function blood test for diabetes  04/01/2024   Medicare Annual Wellness Visit  04/30/2024   Colon Cancer Screening  07/05/2024   Pneumonia Vaccine  Completed   Hepatitis C Screening  Completed   HPV Vaccine  Aged Out    Advanced directives: no  Conditions/risks identified: none  Next appointment: Follow up in one year for your annual wellness visit. 05/06/24 @ 11:30 am by phone  Preventive Care 65 Years and Older, Male  Preventive care refers to lifestyle choices and visits with your health care provider that can promote health and wellness. What does preventive care include? A yearly physical exam. This is also called an annual well check. Dental exams once or twice a year. Routine eye exams. Ask your health care provider how often you should have your eyes checked. Personal lifestyle choices, including: Daily care of your teeth and gums. Regular physical activity. Eating a healthy diet. Avoiding tobacco and drug use. Limiting alcohol use. Practicing safe sex. Taking low doses of aspirin every day. Taking vitamin and mineral  supplements as recommended by your health care provider. What happens during an annual well check? The services and screenings done by your health care provider during your annual well check will depend on your age, overall health, lifestyle risk factors, and family history of disease. Counseling  Your health care provider may ask you questions about your: Alcohol use. Tobacco use. Drug use. Emotional well-being. Home and relationship well-being. Sexual activity. Eating habits. History of falls. Memory and ability to understand (cognition). Work and work Astronomer. Screening  You may have the following tests or measurements: Height, weight, and BMI. Blood pressure. Lipid and cholesterol levels. These may be checked every 5 years, or more frequently if you are over 80 years old. Skin check. Lung cancer screening. You may have this screening every year starting at age 35 if you have a 30-pack-year history of smoking and currently smoke or have quit within the past 15 years. Fecal occult blood test (FOBT) of the stool. You may have this test every year starting at age 76. Flexible sigmoidoscopy or colonoscopy. You may have a sigmoidoscopy every 5 years or a colonoscopy every 10 years starting at age 56. Prostate cancer screening. Recommendations will vary depending on your family history and other risks. Hepatitis C blood test. Hepatitis B blood test. Sexually transmitted disease (STD) testing. Diabetes screening. This is done by checking your blood  sugar (glucose) after you have not eaten for a while (fasting). You may have this done every 1-3 years. Abdominal aortic aneurysm (AAA) screening. You may need this if you are a current or former smoker. Osteoporosis. You may be screened starting at age 30 if you are at high risk. Talk with your health care provider about your test results, treatment options, and if necessary, the need for more tests. Vaccines  Your health care provider  may recommend certain vaccines, such as: Influenza vaccine. This is recommended every year. Tetanus, diphtheria, and acellular pertussis (Tdap, Td) vaccine. You may need a Td booster every 10 years. Zoster vaccine. You may need this after age 40. Pneumococcal 13-valent conjugate (PCV13) vaccine. One dose is recommended after age 61. Pneumococcal polysaccharide (PPSV23) vaccine. One dose is recommended after age 46. Talk to your health care provider about which screenings and vaccines you need and how often you need them. This information is not intended to replace advice given to you by your health care provider. Make sure you discuss any questions you have with your health care provider. Document Released: 10/22/2015 Document Revised: 06/14/2016 Document Reviewed: 07/27/2015 Elsevier Interactive Patient Education  2017 [employer].  Fall Prevention in the Home Falls can cause injuries. They can happen to people of all ages. There are many things you can do to make your home safe and to help prevent falls. What can I do on the outside of my home? Regularly fix the edges of walkways and driveways and fix any cracks. Remove anything that might make you trip as you walk through a door, such as a raised step or threshold. Trim any bushes or trees on the path to your home. Use bright outdoor lighting. Clear any walking paths of anything that might make someone trip, such as rocks or tools. Regularly check to see if handrails are loose or broken. Make sure that both sides of any steps have handrails. Any raised decks and porches should have guardrails on the edges. Have any leaves, snow, or ice cleared regularly. Use sand or salt on walking paths during winter. Clean up any spills in your garage right away. This includes oil or grease spills. What can I do in the bathroom? Use night lights. Install grab bars by the toilet and in the tub and shower. Do not use towel bars as grab bars. Use  non-skid mats or decals in the tub or shower. If you need to sit down in the shower, use a plastic, non-slip stool. Keep the floor dry. Clean up any water that spills on the floor as soon as it happens. Remove soap buildup in the tub or shower regularly. Attach bath mats securely with double-sided non-slip rug tape. Do not have throw rugs and other things on the floor that can make you trip. What can I do in the bedroom? Use night lights. Make sure that you have a light by your bed that is easy to reach. Do not use any sheets or blankets that are too big for your bed. They should not hang down onto the floor. Have a firm chair that has side arms. You can use this for support while you get dressed. Do not have throw rugs and other things on the floor that can make you trip. What can I do in the kitchen? Clean up any spills right away. Avoid walking on wet floors. Keep items that you use a lot in easy-to-reach places. If you need to reach something above you,  use a strong step stool that has a grab bar. Keep electrical cords out of the way. Do not use floor polish or wax that makes floors slippery. If you must use wax, use non-skid floor wax. Do not have throw rugs and other things on the floor that can make you trip. What can I do with my stairs? Do not leave any items on the stairs. Make sure that there are handrails on both sides of the stairs and use them. Fix handrails that are broken or loose. Make sure that handrails are as long as the stairways. Check any carpeting to make sure that it is firmly attached to the stairs. Fix any carpet that is loose or worn. Avoid having throw rugs at the top or bottom of the stairs. If you do have throw rugs, attach them to the floor with carpet tape. Make sure that you have a light switch at the top of the stairs and the bottom of the stairs. If you do not have them, ask someone to add them for you. What else can I do to help prevent falls? Wear  shoes that: Do not have high heels. Have rubber bottoms. Are comfortable and fit you well. Are closed at the toe. Do not wear sandals. If you use a stepladder: Make sure that it is fully opened. Do not climb a closed stepladder. Make sure that both sides of the stepladder are locked into place. Ask someone to hold it for you, if possible. Clearly mark and make sure that you can see: Any grab bars or handrails. First and last steps. Where the edge of each step is. Use tools that help you move around (mobility aids) if they are needed. These include: Canes. Walkers. Scooters. Crutches. Turn on the lights when you go into a dark area. Replace any light bulbs as soon as they burn out. Set up your furniture so you have a clear path. Avoid moving your furniture around. If any of your floors are uneven, fix them. If there are any pets around you, be aware of where they are. Review your medicines with your doctor. Some medicines can make you feel dizzy. This can increase your chance of falling. Ask your doctor what other things that you can do to help prevent falls. This information is not intended to replace advice given to you by your health care provider. Make sure you discuss any questions you have with your health care provider. Document Released: 07/22/2009 Document Revised: 03/02/2016 Document Reviewed: 10/30/2014 Elsevier Interactive Patient Education  2017 [employer].

## 2023-08-17 ENCOUNTER — Other Ambulatory Visit: Payer: Self-pay | Admitting: Family Medicine

## 2023-08-17 NOTE — Telephone Encounter (Signed)
Requested medication (s) are due for refill today - yes  Requested medication (s) are on the active medication list -yes  Future visit scheduled -yes  Last refill: 04/02/23 10.7g 2RF  Notes to clinic: off protocol- provider review   Requested Prescriptions  Pending Prescriptions Disp Refills   BREZTRI AEROSPHERE 160-9-4.8 MCG/ACT AERO [Pharmacy Med Name: BREZTRI AEROSPHERE INHALER] 10.7 g 2    Sig: INHALE TWO PUFFS BY MOUTH TWICE A DAY IN THE MORNING AND IN THE EVENING     Off-Protocol Failed - 08/17/2023  6:22 AM      Failed - Medication not assigned to a protocol, review manually.      Passed - Valid encounter within last 12 months    Recent Outpatient Visits           4 months ago Hyperlipidemia associated with type 2 diabetes mellitus (HCC)   [facility] Pearley, Sherran Needs, NP   8 months ago Pulmonary emphysema, unspecified emphysema type [facility])   [facility] Larae Grooms, NP   8 months ago COPD exacerbation [facility])   [place] [facility] Larae Grooms, NP   9 months ago Atherosclerosis of aorta [facility])   [place] [facility] Larae Grooms, NP   10 months ago Pulmonary emphysema, unspecified emphysema type [facility])   [place] [facility] Larae Grooms, NP       Future Appointments             In 1 month Larae Grooms, NP [place] [facility]               Requested Prescriptions  Pending Prescriptions Disp Refills   BREZTRI AEROSPHERE 160-9-4.8 MCG/ACT AERO [Pharmacy Med Name: BREZTRI AEROSPHERE INHALER] 10.7 g 2    Sig: INHALE TWO PUFFS BY MOUTH TWICE A DAY IN THE MORNING AND IN THE EVENING     Off-Protocol Failed - 08/17/2023  6:22 AM      Failed - Medication not assigned to a protocol, review manually.      Passed - Valid encounter within last 12 months    Recent Outpatient Visits           4 months ago  Hyperlipidemia associated with type 2 diabetes mellitus (HCC)   [facility] Pearley, Sherran Needs, NP   8 months ago Pulmonary emphysema, unspecified emphysema type [facility])   [facility] Larae Grooms, NP   8 months ago COPD exacerbation [facility])   [facility] Larae Grooms, NP   9 months ago Atherosclerosis of aorta [facility])   [place] [facility] Larae Grooms, NP   10 months ago Pulmonary emphysema, unspecified emphysema type [facility])   [facility] Larae Grooms, NP       Future Appointments             In 1 month Larae Grooms, NP [facility]

## 2023-08-29 ENCOUNTER — Other Ambulatory Visit: Payer: Self-pay | Admitting: Family Medicine

## 2023-08-29 DIAGNOSIS — J439 Emphysema, unspecified: Secondary | ICD-10-CM

## 2023-08-30 NOTE — Telephone Encounter (Signed)
Requested Prescriptions  Pending Prescriptions Disp Refills   albuterol (VENTOLIN HFA) 108 (90 Base) MCG/ACT inhaler [Pharmacy Med Name: ALBUTEROL HFA 90 MCG INHALER] 8.5 g 1    Sig: INHALE 1 TO 2 PUFFS INTO THE LUNGS EVERY 4 HOURS AS NEEDED FOR WHEEZING     Pulmonology:  Beta Agonists 2 Passed - 08/29/2023  2:28 PM      Passed - Last BP in normal range    BP Readings from Last 1 Encounters:  04/02/23 107/72         Passed - Last Heart Rate in normal range    Pulse Readings from Last 1 Encounters:  04/02/23 68         Passed - Valid encounter within last 12 months    Recent Outpatient Visits           5 months ago Hyperlipidemia associated with type 2 diabetes mellitus (HCC)   [facility] [facility] Pearley, Sherran Needs, NP   8 months ago Pulmonary emphysema, unspecified emphysema type [facility])   [place] [facility] Larae Grooms, NP   8 months ago COPD exacerbation [facility])   [facility] Larae Grooms, NP   10 months ago Atherosclerosis of aorta [facility])   [place] [facility] Larae Grooms, NP   11 months ago Pulmonary emphysema, unspecified emphysema type [facility])   [place] [facility] Larae Grooms, NP       Future Appointments             In 1 month Larae Grooms, NP  [facility]

## 2023-09-11 ENCOUNTER — Other Ambulatory Visit: Payer: Self-pay | Admitting: Nurse Practitioner

## 2023-09-13 NOTE — Telephone Encounter (Signed)
Labs in date  Requested Prescriptions  Pending Prescriptions Disp Refills   rosuvastatin (CRESTOR) 10 MG tablet [Pharmacy Med Name: ROSUVASTATIN CALCIUM 10 MG TAB] 90 tablet 1    Sig: TAKE 1 TABLET BY MOUTH DAILY     Cardiovascular:  Antilipid - Statins 2 Failed - 09/11/2023  6:23 AM      Failed - Cr in normal range and within 360 days    Creatinine, Ser  Date Value Ref Range Status  04/02/2023 1.44 (H) 0.76 - 1.27 mg/dL Final         Failed - Lipid Panel in normal range within the last 12 months    Cholesterol, Total  Date Value Ref Range Status  04/02/2023 147 100 - 199 mg/dL Final   LDL Chol Calc (NIH)  Date Value Ref Range Status  04/02/2023 75 0 - 99 mg/dL Final   HDL  Date Value Ref Range Status  04/02/2023 44 >39 mg/dL Final   Triglycerides  Date Value Ref Range Status  04/02/2023 166 (H) 0 - 149 mg/dL Final         Passed - Patient is not pregnant      Passed - Valid encounter within last 12 months    Recent Outpatient Visits           5 months ago Hyperlipidemia associated with type 2 diabetes mellitus (HCC)   [place] [facility] Pearley, Sherran Needs, NP   9 months ago Pulmonary emphysema, unspecified emphysema type [facility])   [facility] Larae Grooms, NP   9 months ago COPD exacerbation [facility])   [place] [facility] Larae Grooms, NP   10 months ago Atherosclerosis of aorta [facility])   [facility] Larae Grooms, NP   11 months ago Pulmonary emphysema, unspecified emphysema type [facility])   [place] [facility] Larae Grooms, NP       Future Appointments             In 3 weeks Larae Grooms, NP [place] [facility]

## 2023-10-04 ENCOUNTER — Ambulatory Visit (INDEPENDENT_AMBULATORY_CARE_PROVIDER_SITE_OTHER): Payer: HMO | Admitting: Nurse Practitioner

## 2023-10-04 ENCOUNTER — Encounter: Payer: Self-pay | Admitting: Nurse Practitioner

## 2023-10-04 VITALS — BP 123/72 | HR 81 | Temp 97.7°F | Ht 66.0 in | Wt 148.0 lb

## 2023-10-04 DIAGNOSIS — N183 Chronic kidney disease, stage 3 unspecified: Secondary | ICD-10-CM

## 2023-10-04 DIAGNOSIS — I700 Atherosclerosis of aorta: Secondary | ICD-10-CM | POA: Diagnosis not present

## 2023-10-04 DIAGNOSIS — J439 Emphysema, unspecified: Secondary | ICD-10-CM

## 2023-10-04 DIAGNOSIS — E1122 Type 2 diabetes mellitus with diabetic chronic kidney disease: Secondary | ICD-10-CM

## 2023-10-04 DIAGNOSIS — J441 Chronic obstructive pulmonary disease with (acute) exacerbation: Secondary | ICD-10-CM

## 2023-10-04 DIAGNOSIS — N1831 Chronic kidney disease, stage 3a: Secondary | ICD-10-CM | POA: Diagnosis not present

## 2023-10-04 LAB — MICROALBUMIN, URINE WAIVED
Creatinine, Urine Waived: 300 mg/dL (ref 10–300)
Microalb, Ur Waived: 150 mg/L — ABNORMAL HIGH (ref 0–19)

## 2023-10-04 MED ORDER — LIDOCAINE VISCOUS HCL 2 % MT SOLN
15.0000 mL | Freq: Four times a day (QID) | OROMUCOSAL | 0 refills | Status: DC | PRN
Start: 2023-10-04 — End: 2024-02-13

## 2023-10-04 MED ORDER — METHYLPREDNISOLONE SODIUM SUCC 40 MG IJ SOLR
40.0000 mg | Freq: Once | INTRAMUSCULAR | Status: AC
  Administered 2023-10-04: 40 mg via INTRAMUSCULAR

## 2023-10-04 MED ORDER — AZITHROMYCIN 250 MG PO TABS: ORAL_TABLET | ORAL | 0 refills | Status: AC

## 2023-10-04 MED ORDER — BENZONATATE 200 MG PO CAPS: 200.0000 mg | ORAL_CAPSULE | Freq: Two times a day (BID) | ORAL | 0 refills | Status: DC | PRN

## 2023-10-04 NOTE — Assessment & Plan Note (Signed)
Chronic, ongoing. A1C 6.5 today. Diet controlled, not currently on medication regimen. Continue heart healthy diet and exercise of 150 mins weekly. Needs Eye exam and ARB for kidney protection.  Recommend scheduling eye exam with ophthalmologist.

## 2023-10-04 NOTE — Assessment & Plan Note (Signed)
Chronic. Continue with Crestor.  Labs ordered today.

## 2023-10-04 NOTE — Assessment & Plan Note (Signed)
Exacerbated at visit today.  Wheezing on exam.  Duoneb done in office today.  Solumedrol 40mg  given in office.  Will treat with azithromycin and Prednisone taper starting tomorrow.  Follow up in 1 week.  Call sooner if concerns arise.

## 2023-10-04 NOTE — Progress Notes (Signed)
BP 123/72 (BP Location: Left Arm, Patient Position: Sitting, Cuff Size: Normal)   Pulse 81   Temp 97.7 F (36.5 C) (Oral)   Ht 5\' 6"  (1.676 m)   Wt 148 lb (67.1 kg)   SpO2 98%   BMI 23.89 kg/m    Subjective:    Patient ID: Terry French, male    DOB: 1951/09/08, 72 y.o.   MRN: [id number]  HPI: Terry French is a 72 y.o. male  Chief Complaint  Patient presents with   6 month follow up   COPD   Hyperlipidemia   Diabetes   Cough   Shortness of Breath    Has a real bad spell at times where he can't catch his breath   COPD Not well controlled.  Patient's symptoms started about a week ago.  Cough, SOB, wheezing, a lot of mucous COPD status: Not well controlled.  He is using his Breztri and Albuterol. Satisfied with current treatment?: yes Oxygen use: no Dyspnea frequency: daily Cough frequency: daily Rescue inhaler frequency: a couple times a day right now Limitation of activity: no Productive cough: yes Last Spirometry:  Pneumovax: up to date Influenza: Not up to Date  CHRONIC KIDNEY DISEASE CKD status: controlled Medications renally dose: yes Previous renal evaluation: no Pneumovax:  Up to Date Influenza Vaccine:  Not up to Date  DIABETES Hypoglycemic episodes:no Polydipsia/polyuria: no Visual disturbance: no Chest pain: no Paresthesias: no Glucose Monitoring: no  Accucheck frequency: Not Checking  Fasting glucose:  Post prandial:  Evening:  Before meals: Taking Insulin?: no  Long acting insulin:  Short acting insulin: Blood Pressure Monitoring: not checking Retinal Examination: Not up to Date Foot Exam: Up to Date Diabetic Education: Not Completed Pneumovax: Up to Date Influenza: Up to Date Aspirin: no   Relevant past medical, surgical, family and social history reviewed and updated as indicated. Interim medical history since our last visit reviewed. Allergies and medications reviewed and updated.  Review of Systems  Constitutional:  Negative  for fever.  HENT:  Positive for congestion.   Eyes:  Negative for visual disturbance.  Respiratory:  Positive for cough, chest tightness, shortness of breath and wheezing.   Cardiovascular:  Negative for chest pain, palpitations and leg swelling.  Endocrine: Negative for polydipsia and polyuria.  Neurological:  Negative for dizziness, light-headedness, numbness and headaches.    Per HPI unless specifically indicated above     Objective:    BP 123/72 (BP Location: Left Arm, Patient Position: Sitting, Cuff Size: Normal)   Pulse 81   Temp 97.7 F (36.5 C) (Oral)   Ht 5\' 6"  (1.676 m)   Wt 148 lb (67.1 kg)   SpO2 98%   BMI 23.89 kg/m   Wt Readings from Last 3 Encounters:  10/04/23 148 lb (67.1 kg)  05/01/23 154 lb (69.9 kg)  04/02/23 154 lb 3.2 oz (69.9 kg)    Physical Exam Vitals and nursing note reviewed.  Constitutional:      General: He is not in acute distress.    Appearance: Normal appearance. He is not ill-appearing, toxic-appearing or diaphoretic.  HENT:     Head: Normocephalic.     Right Ear: External ear normal.     Left Ear: External ear normal.     Nose: Nose normal. No congestion or rhinorrhea.     Mouth/Throat:     Mouth: Mucous membranes are moist.  Eyes:     General:        Right eye: No  discharge.        Left eye: No discharge.     Extraocular Movements: Extraocular movements intact.     Conjunctiva/sclera: Conjunctivae normal.     Pupils: Pupils are equal, round, and reactive to light.  Cardiovascular:     Rate and Rhythm: Normal rate and regular rhythm.     Heart sounds: No murmur heard. Pulmonary:     Effort: Pulmonary effort is normal. No respiratory distress.     Breath sounds: Wheezing present. No rhonchi or rales.  Abdominal:     General: Abdomen is flat. Bowel sounds are normal.  Musculoskeletal:     Cervical back: Normal range of motion and neck supple.  Skin:    General: Skin is warm and dry.     Capillary Refill: Capillary refill takes  less than 2 seconds.  Neurological:     General: No focal deficit present.     Mental Status: He is alert and oriented to person, place, and time.  Psychiatric:        Mood and Affect: Mood normal.        Behavior: Behavior normal.        Thought Content: Thought content normal.        Judgment: Judgment normal.     Results for orders placed or performed in visit on 04/02/23  Bayer DCA Hb A1c Waived   Collection Time: 04/02/23  3:24 PM  Result Value Ref Range   HB A1C (BAYER DCA - WAIVED) 6.5 (H) 4.8 - 5.6 %  Lipid Profile   Collection Time: 04/02/23  3:25 PM  Result Value Ref Range   Cholesterol, Total 147 100 - 199 mg/dL   Triglycerides 782 (H) 0 - 149 mg/dL   HDL 44 >95 mg/dL   VLDL Cholesterol Cal 28 5 - 40 mg/dL   LDL Chol Calc (NIH) 75 0 - 99 mg/dL   Chol/HDL Ratio 3.3 0.0 - 5.0 ratio  Comp Met (CMET)   Collection Time: 04/02/23  3:25 PM  Result Value Ref Range   Glucose 105 (H) 70 - 99 mg/dL   BUN 19 8 - 27 mg/dL   Creatinine, Ser 6.21 (H) 0.76 - 1.27 mg/dL   eGFR 53 (L) >30 QM/VHQ/4.69   BUN/Creatinine Ratio 13 10 - 24   Sodium 136 134 - 144 mmol/L   Potassium 4.7 3.5 - 5.2 mmol/L   Chloride 101 96 - 106 mmol/L   CO2 21 20 - 29 mmol/L   Calcium 9.2 8.6 - 10.2 mg/dL   Total Protein 7.0 6.0 - 8.5 g/dL   Albumin 4.3 3.9 - 4.9 g/dL   Globulin, Total 2.7 1.5 - 4.5 g/dL   Bilirubin Total <6.2 0.0 - 1.2 mg/dL   Alkaline Phosphatase 68 44 - 121 IU/L   AST 18 0 - 40 IU/L   ALT 13 0 - 44 IU/L      Assessment & Plan:   Problem List Items Addressed This Visit       Cardiovascular and Mediastinum   Atherosclerosis of aorta (HCC)   Chronic. Continue with Crestor.  Labs ordered today.        Relevant Orders   Lipid Profile     Respiratory   Emphysema lung (HCC) - Primary   Chronic, ongoing. Continue taking Breztri daily and albuterol PRN. Refills given today. Referral for lung cancer screening done.       Relevant Medications   Acetaminophen-guaiFENesin  (MUCINEX COLD & FLU PO)   Other Relevant Orders  Ambulatory Referral Lung Cancer Screening [facility]   COPD with acute exacerbation (HCC)   Exacerbated at visit today.  Wheezing on exam.  Duoneb done in office today.  Solumedrol 40mg  given in office.  Will treat with azithromycin.  Did not tolerate Oral Prednisone in the past.  Follow up in 1 week.  Call sooner if concerns arise.       Relevant Medications   Acetaminophen-guaiFENesin (MUCINEX COLD & FLU PO)     Endocrine   Diabetes type 2, controlled (HCC)   Chronic, ongoing. A1C 6.5 today. Diet controlled, not currently on medication regimen. Continue heart healthy diet and exercise of 150 mins weekly. Needs Eye exam and ARB for kidney protection.  Recommend scheduling eye exam with ophthalmologist.       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  Controlled.  Continue with current medication regimen.  Needs ACE/ARB.  Will likely start ARB due to COPD at next visit.  Labs ordered today.  Return to clinic in 6 months for reevaluation.  Call sooner if concerns arise.          Follow up plan: Return in about 1 week (around 10/11/2023) for COPD check.

## 2023-10-04 NOTE — Assessment & Plan Note (Signed)
Chronic, ongoing. Continue taking Breztri daily and albuterol PRN. Refills given today. Referral for lung cancer screening done.

## 2023-10-04 NOTE — Assessment & Plan Note (Signed)
Chronic.  Controlled.  Continue with current medication regimen.  Needs ACE/ARB.  Will likely start ARB due to COPD at next visit.  Labs ordered today.  Return to clinic in 6 months for reevaluation.  Call sooner if concerns arise.

## 2023-10-05 LAB — COMPREHENSIVE METABOLIC PANEL
ALT: 17 [IU]/L (ref 0–44)
AST: 23 [IU]/L (ref 0–40)
Albumin: 4.2 g/dL (ref 3.9–4.9)
Alkaline Phosphatase: 80 [IU]/L (ref 44–121)
BUN/Creatinine Ratio: 16 (ref 10–24)
BUN: 29 mg/dL — ABNORMAL HIGH (ref 8–27)
Bilirubin Total: 0.3 mg/dL (ref 0.0–1.2)
CO2: 20 mmol/L (ref 20–29)
Calcium: 9.6 mg/dL (ref 8.6–10.2)
Chloride: 99 mmol/L (ref 96–106)
Creatinine, Ser: 1.8 mg/dL — ABNORMAL HIGH (ref 0.76–1.27)
Globulin, Total: 3.1 g/dL (ref 1.5–4.5)
Glucose: 118 mg/dL — ABNORMAL HIGH (ref 70–99)
Potassium: 4.9 mmol/L (ref 3.5–5.2)
Sodium: 137 mmol/L (ref 134–144)
Total Protein: 7.3 g/dL (ref 6.0–8.5)
eGFR: 40 mL/min/{1.73_m2} — ABNORMAL LOW (ref >59)

## 2023-10-05 LAB — LIPID PANEL
Chol/HDL Ratio: 2.9 {ratio} (ref 0.0–5.0)
Cholesterol, Total: 109 mg/dL (ref 100–199)
HDL: 37 mg/dL — ABNORMAL LOW (ref >39)
LDL Chol Calc (NIH): 54 mg/dL (ref 0–99)
Triglycerides: 91 mg/dL (ref 0–149)
VLDL Cholesterol Cal: 18 mg/dL (ref 5–40)

## 2023-10-05 LAB — HEMOGLOBIN A1C
Est. average glucose Bld gHb Est-mCnc: 140 mg/dL
Hgb A1c MFr Bld: 6.5 % — ABNORMAL HIGH (ref 4.8–5.6)

## 2023-10-11 ENCOUNTER — Ambulatory Visit (INDEPENDENT_AMBULATORY_CARE_PROVIDER_SITE_OTHER): Payer: HMO | Admitting: Nurse Practitioner

## 2023-10-11 ENCOUNTER — Encounter: Payer: Self-pay | Admitting: Nurse Practitioner

## 2023-10-11 VITALS — BP 105/70 | HR 70 | Temp 97.6°F | Ht 66.0 in | Wt 148.2 lb

## 2023-10-11 DIAGNOSIS — J439 Emphysema, unspecified: Secondary | ICD-10-CM

## 2023-10-11 DIAGNOSIS — F322 Major depressive disorder, single episode, severe without psychotic features: Secondary | ICD-10-CM | POA: Insufficient documentation

## 2023-10-11 MED ORDER — CETIRIZINE HCL 10 MG PO TABS: 10.0000 mg | ORAL_TABLET | Freq: Every day | ORAL | 11 refills | Status: DC

## 2023-10-11 MED ORDER — CITALOPRAM HYDROBROMIDE 10 MG PO TABS: 10.0000 mg | ORAL_TABLET | Freq: Every day | ORAL | 0 refills | Status: DC

## 2023-10-11 NOTE — Progress Notes (Signed)
 BP 105/70 (BP Location: Left Arm, Patient Position: Sitting, Cuff Size: Normal)   Pulse 70   Temp 97.6 F (36.4 C) (Oral)   Ht 5' 6 (1.676 m)   Wt 148 lb 3.2 oz (67.2 kg)   SpO2 97%   BMI 23.92 kg/m    Subjective:    Patient ID: Terry French, male    DOB: 1951-04-18, 73 y.o.   MRN: [id number]  HPI: Terry French is a 73 y.o. male  Chief Complaint  Patient presents with   COPD   Follow-up   COPD Improved.  Breathing is not back to baseline.  He is still having a cough and using his albuterol  a couple of times per day.  He still feels sick but does feel better. Satisfied with current treatment?: yes Oxygen use: no Dyspnea frequency: daily Cough frequency: daily Rescue inhaler frequency: a couple times a day right now Limitation of activity: no Productive cough: yes Last Spirometry:  Pneumovax: up to date Influenza: Not up to Date   DEPRESSION Patient states he does feel down, depressed and hopeless. He has never been on medication before and is very hesitant to start medication.  However, he admits to anxiety and trouble sleeping.  He denies SI.  He does agree that he would benefit from being less on edge and fidgeting all the time.  Relevant past medical, surgical, family and social history reviewed and updated as indicated. Interim medical history since our last visit reviewed. Allergies and medications reviewed and updated.  Review of Systems  HENT:  Positive for congestion.   Respiratory:  Positive for choking and shortness of breath.   Psychiatric/Behavioral:  Positive for dysphoric mood and sleep disturbance. Negative for suicidal ideas. The patient is nervous/anxious.     Per HPI unless specifically indicated above     Objective:    BP 105/70 (BP Location: Left Arm, Patient Position: Sitting, Cuff Size: Normal)   Pulse 70   Temp 97.6 F (36.4 C) (Oral)   Ht 5' 6 (1.676 m)   Wt 148 lb 3.2 oz (67.2 kg)   SpO2 97%   BMI 23.92 kg/m   Wt Readings from  Last 3 Encounters:  10/11/23 148 lb 3.2 oz (67.2 kg)  10/04/23 148 lb (67.1 kg)  05/01/23 154 lb (69.9 kg)    Physical Exam Vitals and nursing note reviewed.  Constitutional:      General: He is not in acute distress.    Appearance: Normal appearance. He is not ill-appearing, toxic-appearing or diaphoretic.  HENT:     Head: Normocephalic.     Right Ear: External ear normal.     Left Ear: External ear normal.     Nose: Nose normal. No congestion or rhinorrhea.     Mouth/Throat:     Mouth: Mucous membranes are moist.  Eyes:     General:        Right eye: No discharge.        Left eye: No discharge.     Extraocular Movements: Extraocular movements intact.     Conjunctiva/sclera: Conjunctivae normal.     Pupils: Pupils are equal, round, and reactive to light.  Cardiovascular:     Rate and Rhythm: Normal rate and regular rhythm.     Heart sounds: No murmur heard. Pulmonary:     Effort: Pulmonary effort is normal. No respiratory distress.     Breath sounds: Wheezing present. No rhonchi or rales.  Abdominal:     General: Abdomen  is flat. Bowel sounds are normal.  Musculoskeletal:     Cervical back: Normal range of motion and neck supple.  Skin:    General: Skin is warm and dry.     Capillary Refill: Capillary refill takes less than 2 seconds.  Neurological:     General: No focal deficit present.     Mental Status: He is alert and oriented to person, place, and time.  Psychiatric:        Mood and Affect: Mood normal.        Behavior: Behavior normal.        Thought Content: Thought content normal.        Judgment: Judgment normal.     Results for orders placed or performed in visit on 10/04/23  Microalbumin, Urine Waived   Collection Time: 10/04/23  2:47 PM  Result Value Ref Range   Microalb, Ur Waived 150 (H) 0 - 19 mg/L   Creatinine, Urine Waived 300 10 - 300 mg/dL   Microalb/Creat Ratio 30-300 (H) <30 mg/g  Comp Met (CMET)   Collection Time: 10/04/23  2:48 PM   Result Value Ref Range   Glucose 118 (H) 70 - 99 mg/dL   BUN 29 (H) 8 - 27 mg/dL   Creatinine, Ser 8.19 (H) 0.76 - 1.27 mg/dL   eGFR 40 (L) >40 fO/fpw/8.26   BUN/Creatinine Ratio 16 10 - 24   Sodium 137 134 - 144 mmol/L   Potassium 4.9 3.5 - 5.2 mmol/L   Chloride 99 96 - 106 mmol/L   CO2 20 20 - 29 mmol/L   Calcium  9.6 8.6 - 10.2 mg/dL   Total Protein 7.3 6.0 - 8.5 g/dL   Albumin 4.2 3.9 - 4.9 g/dL   Globulin, Total 3.1 1.5 - 4.5 g/dL   Bilirubin Total 0.3 0.0 - 1.2 mg/dL   Alkaline Phosphatase 80 44 - 121 IU/L   AST 23 0 - 40 IU/L   ALT 17 0 - 44 IU/L  Lipid Profile   Collection Time: 10/04/23  2:48 PM  Result Value Ref Range   Cholesterol, Total 109 100 - 199 mg/dL   Triglycerides 91 0 - 149 mg/dL   HDL 37 (L) >60 mg/dL   VLDL Cholesterol Cal 18 5 - 40 mg/dL   LDL Chol Calc (NIH) 54 0 - 99 mg/dL   Chol/HDL Ratio 2.9 0.0 - 5.0 ratio  HgB A1c   Collection Time: 10/04/23  2:48 PM  Result Value Ref Range   Hgb A1c MFr Bld 6.5 (H) 4.8 - 5.6 %   Est. average glucose Bld gHb Est-mCnc 140 mg/dL      Assessment & Plan:   Problem List Items Addressed This Visit       Respiratory   Emphysema lung (HCC) - Primary   Chronic.  Improved from last visit.  Still wheezing on exam.  Continue with PRN albuterol  and scheduled Breztri .  Follow up in 1 month.  Call sooner if concerns arise.       Relevant Medications   cetirizine  (ZYRTEC ) 10 MG tablet   Other Relevant Orders   Comp Met (CMET)     Other   Moderately severe major depression (HCC)   Chronic.  Not well controlled.  Patient is medication Naive.  Will start Celexa  10mg  daily.  Side effects and benefits of medication discussed during visit.  Follow up in 1 month.  Call sooner if concerns arise.       Relevant Medications   citalopram  (CELEXA ) 10  MG tablet     Follow up plan: Return in about 1 month (around 11/11/2023) for Medication Management.

## 2023-10-11 NOTE — Assessment & Plan Note (Signed)
 Chronic.  Improved from last visit.  Still wheezing on exam.  Continue with PRN albuterol and scheduled Breztri.  Follow up in 1 month.  Call sooner if concerns arise.

## 2023-10-11 NOTE — Assessment & Plan Note (Signed)
 Chronic.  Not well controlled.  Patient is medication Naive.  Will start Celexa 10mg  daily.  Side effects and benefits of medication discussed during visit.  Follow up in 1 month.  Call sooner if concerns arise.

## 2023-10-12 LAB — COMPREHENSIVE METABOLIC PANEL
ALT: 14 [IU]/L (ref 0–44)
AST: 19 [IU]/L (ref 0–40)
Albumin: 4.1 g/dL (ref 3.9–4.9)
Alkaline Phosphatase: 62 [IU]/L (ref 44–121)
BUN/Creatinine Ratio: 15 (ref 10–24)
BUN: 26 mg/dL (ref 8–27)
Bilirubin Total: 0.3 mg/dL (ref 0.0–1.2)
CO2: 22 mmol/L (ref 20–29)
Calcium: 9.4 mg/dL (ref 8.6–10.2)
Chloride: 100 mmol/L (ref 96–106)
Creatinine, Ser: 1.72 mg/dL — ABNORMAL HIGH (ref 0.76–1.27)
Globulin, Total: 2.6 g/dL (ref 1.5–4.5)
Glucose: 109 mg/dL — ABNORMAL HIGH (ref 70–99)
Potassium: 4.8 mmol/L (ref 3.5–5.2)
Sodium: 136 mmol/L (ref 134–144)
Total Protein: 6.7 g/dL (ref 6.0–8.5)
eGFR: 42 mL/min/{1.73_m2} — ABNORMAL LOW (ref >59)

## 2023-11-15 ENCOUNTER — Encounter: Payer: Self-pay | Admitting: Nurse Practitioner

## 2023-11-15 ENCOUNTER — Telehealth: Payer: Self-pay

## 2023-11-15 ENCOUNTER — Ambulatory Visit
Admission: RE | Admit: 2023-11-15 | Discharge: 2023-11-15 | Disposition: A | Discharge status: Home / Self Care | Payer: HMO | Source: Ambulatory Visit | Attending: Nurse Practitioner | Admitting: Nurse Practitioner

## 2023-11-15 ENCOUNTER — Ambulatory Visit
Admission: RE | Admit: 2023-11-15 | Discharge: 2023-11-15 | Disposition: A | Discharge status: Home / Self Care | Payer: HMO | Attending: Nurse Practitioner | Admitting: Nurse Practitioner

## 2023-11-15 ENCOUNTER — Ambulatory Visit (INDEPENDENT_AMBULATORY_CARE_PROVIDER_SITE_OTHER): Payer: HMO | Admitting: Nurse Practitioner

## 2023-11-15 VITALS — BP 122/77 | HR 80 | Ht 66.0 in | Wt 142.8 lb

## 2023-11-15 DIAGNOSIS — R0602 Shortness of breath: Secondary | ICD-10-CM | POA: Insufficient documentation

## 2023-11-15 DIAGNOSIS — F322 Major depressive disorder, single episode, severe without psychotic features: Secondary | ICD-10-CM | POA: Diagnosis not present

## 2023-11-15 MED ORDER — SERTRALINE HCL 25 MG PO TABS: 25.0000 mg | ORAL_TABLET | Freq: Every day | ORAL | 0 refills | Status: DC

## 2023-11-15 MED ORDER — PREDNISONE 10 MG PO TABS: 10.0000 mg | ORAL_TABLET | Freq: Every day | ORAL | 0 refills | Status: DC

## 2023-11-15 NOTE — Assessment & Plan Note (Signed)
 Chronic.  Will change from Celexa  to Zoloft .  Side effects and benefits discussed.  Follow up in 2 weeks.  Call sooner if concerns arise.

## 2023-11-15 NOTE — Progress Notes (Signed)
 Stephenie Einstein (relative) was contacted to relay message to [place]. [name] expressed understanding

## 2023-11-15 NOTE — Telephone Encounter (Signed)
-----   Message from Aileen Alexanders sent at 11/15/2023  4:25 PM EST ----- Please call [name], patient's relative, to let her know that the xray showed no evidence of pneumonia on the xray.  Will treat with prednisone  for 12 days.

## 2023-11-15 NOTE — Telephone Encounter (Signed)
 Terry French was contacted on behalf of patient to relay message that the xray did not show signs of pneumonia. Prednisone  was also ordered and sent to pharmacy. [name] expressed understanding

## 2023-11-15 NOTE — Progress Notes (Signed)
 BP 122/77 (BP Location: Right Arm, Patient Position: Sitting, Cuff Size: Large)   Pulse 80   Ht 5' 6 (1.676 m)   Wt 142 lb 12.8 oz (64.8 kg)   SpO2 95%   BMI 23.05 kg/m    Subjective:    Patient ID: Terry French, male    DOB: 1951-04-05, 73 y.o.   MRN: [id number]  HPI: Terry French is a 73 y.o. male  Chief Complaint  Patient presents with   Medication Management   COPD Patient states that his breathing improved but worsened recently.  He has been around people with several viruses.  Feels like his breathing has worsened. Having a cough and SOB.  Feels like he's wheezing.  Satisfied with current treatment?: yes Oxygen use: no Dyspnea frequency: daily Cough frequency: daily Rescue inhaler frequency: a couple times a day right now Limitation of activity: no Productive cough: yes Last Spirometry:  Pneumovax: up to date Influenza: Not up to Date   DEPRESSION Patient states he does feel down, depressed and hopeless. He has been taking the Celexa  but it causes him to have diarrhea.  He would like to change to something else.  Does feel like it helps him feel less anxious.       11/15/2023    2:22 PM 10/11/2023   11:39 AM 10/04/2023    3:20 PM  PHQ9 SCORE ONLY  PHQ-9 Total Score 20 20 15         11/15/2023    2:22 PM 10/11/2023   11:39 AM 10/04/2023    3:20 PM 12/04/2022    1:28 PM  GAD 7 : Generalized Anxiety Score  Nervous, Anxious, on Edge 2 3 2 2   Control/stop worrying 3 3 2 3   Worry too much - different things 3 3 2 3   Trouble relaxing 2 3 3  0  Restless 3 3 3 1   Easily annoyed or irritable 3 3 3 3   Afraid - awful might happen 0 3 1 1   Total GAD 7 Score 16 21 16 13   Anxiety Difficulty    Not difficult at all      Relevant past medical, surgical, family and social history reviewed and updated as indicated. Interim medical history since our last visit reviewed. Allergies and medications reviewed and updated.  Review of Systems  Respiratory:  Positive for  cough, shortness of breath and wheezing.   Psychiatric/Behavioral:  Positive for dysphoric mood and sleep disturbance. Negative for suicidal ideas. The patient is nervous/anxious.     Per HPI unless specifically indicated above     Objective:    BP 122/77 (BP Location: Right Arm, Patient Position: Sitting, Cuff Size: Large)   Pulse 80   Ht 5' 6 (1.676 m)   Wt 142 lb 12.8 oz (64.8 kg)   SpO2 95%   BMI 23.05 kg/m   Wt Readings from Last 3 Encounters:  11/15/23 142 lb 12.8 oz (64.8 kg)  10/11/23 148 lb 3.2 oz (67.2 kg)  10/04/23 148 lb (67.1 kg)    Physical Exam Vitals and nursing note reviewed.  Constitutional:      General: He is not in acute distress.    Appearance: Normal appearance. He is not ill-appearing, toxic-appearing or diaphoretic.  HENT:     Head: Normocephalic.     Right Ear: External ear normal.     Left Ear: External ear normal.     Nose: Nose normal. No congestion or rhinorrhea.     Mouth/Throat:  Mouth: Mucous membranes are moist.  Eyes:     General:        Right eye: No discharge.        Left eye: No discharge.     Extraocular Movements: Extraocular movements intact.     Conjunctiva/sclera: Conjunctivae normal.     Pupils: Pupils are equal, round, and reactive to light.  Cardiovascular:     Rate and Rhythm: Normal rate and regular rhythm.     Heart sounds: No murmur heard. Pulmonary:     Effort: Pulmonary effort is normal. No respiratory distress.     Breath sounds: Wheezing and rhonchi present. No rales.  Abdominal:     General: Abdomen is flat. Bowel sounds are normal.  Musculoskeletal:     Cervical back: Normal range of motion and neck supple.  Skin:    General: Skin is warm and dry.     Capillary Refill: Capillary refill takes less than 2 seconds.  Neurological:     General: No focal deficit present.     Mental Status: He is alert and oriented to person, place, and time.  Psychiatric:        Mood and Affect: Mood normal.         Behavior: Behavior normal.        Thought Content: Thought content normal.        Judgment: Judgment normal.     Results for orders placed or performed in visit on 10/11/23  Comp Met (CMET)   Collection Time: 10/11/23 11:39 AM  Result Value Ref Range   Glucose 109 (H) 70 - 99 mg/dL   BUN 26 8 - 27 mg/dL   Creatinine, Ser 8.27 (H) 0.76 - 1.27 mg/dL   eGFR 42 (L) >40 fO/fpw/8.26   BUN/Creatinine Ratio 15 10 - 24   Sodium 136 134 - 144 mmol/L   Potassium 4.8 3.5 - 5.2 mmol/L   Chloride 100 96 - 106 mmol/L   CO2 22 20 - 29 mmol/L   Calcium  9.4 8.6 - 10.2 mg/dL   Total Protein 6.7 6.0 - 8.5 g/dL   Albumin 4.1 3.9 - 4.9 g/dL   Globulin, Total 2.6 1.5 - 4.5 g/dL   Bilirubin Total 0.3 0.0 - 1.2 mg/dL   Alkaline Phosphatase 62 44 - 121 IU/L   AST 19 0 - 40 IU/L   ALT 14 0 - 44 IU/L      Assessment & Plan:   Problem List Items Addressed This Visit       Other   Moderately severe major depression (HCC) - Primary   Chronic.  Will change from Celexa  to Zoloft .  Side effects and benefits discussed.  Follow up in 2 weeks.  Call sooner if concerns arise.       Relevant Medications   sertraline  (ZOLOFT ) 25 MG tablet   Other Visit Diagnoses       Shortness of breath       Chest xray ordered.  Will await results to determine treatment.  Follow up in 2 weeks.  Call sooner if concerns arise.   Relevant Orders   DG Chest 2 View         Follow up plan: Return in about 2 weeks (around 11/29/2023) for Breathing.

## 2023-11-15 NOTE — Addendum Note (Signed)
 Addended by: Aileen Alexanders on: 11/15/2023 04:25 PM   Modules accepted: Orders

## 2023-11-19 ENCOUNTER — Other Ambulatory Visit: Payer: Self-pay

## 2023-11-19 DIAGNOSIS — J439 Emphysema, unspecified: Secondary | ICD-10-CM

## 2023-11-19 MED ORDER — ALBUTEROL SULFATE HFA 108 (90 BASE) MCG/ACT IN AERS: INHALATION_SPRAY | RESPIRATORY_TRACT | 1 refills | Status: DC

## 2023-11-29 ENCOUNTER — Ambulatory Visit: Payer: HMO | Admitting: Nurse Practitioner

## 2023-12-03 ENCOUNTER — Ambulatory Visit (INDEPENDENT_AMBULATORY_CARE_PROVIDER_SITE_OTHER): Payer: HMO | Admitting: Nurse Practitioner

## 2023-12-03 ENCOUNTER — Encounter: Payer: Self-pay | Admitting: Nurse Practitioner

## 2023-12-03 VITALS — BP 115/79 | HR 72 | Ht 66.0 in | Wt 146.0 lb

## 2023-12-03 DIAGNOSIS — J439 Emphysema, unspecified: Secondary | ICD-10-CM

## 2023-12-03 NOTE — Assessment & Plan Note (Signed)
 Chronic.  Back to baseline.  Continue with Breztri and PRN albuterol use.  Follow up in 2 months. Call sooner if concerns arise.

## 2023-12-03 NOTE — Progress Notes (Signed)
 BP 115/79 (BP Location: Left Arm, Patient Position: Sitting)   Pulse 72   Ht 5\' 6"  (1.676 m)   Wt 146 lb (66.2 kg)   SpO2 98%   BMI 23.57 kg/m    Subjective:    Patient ID: Terry French, male    DOB: [birth year], 73 y.o.   MRN: [id number]  HPI: Terry French is a 73 y.o. male  Chief Complaint  Patient presents with   Follow-up    -Return in about 2 weeks for Breathing. Pt stated--the breathing is much better.   COPD Patient states that he doing a lot better.  He feels like he is back to his baseline.   Satisfied with current treatment?: yes Oxygen use: no Dyspnea frequency: daily Cough frequency: daily Rescue inhaler frequency: a couple times a day right now Limitation of activity: no Productive cough: yes Last Spirometry:  Pneumovax: up to date Influenza: Not up to Date     Relevant past medical, surgical, family and social history reviewed and updated as indicated. Interim medical history since our last visit reviewed. Allergies and medications reviewed and updated.  Review of Systems  Respiratory:  Positive for cough and shortness of breath. Negative for wheezing.     Per HPI unless specifically indicated above     Objective:    BP 115/79 (BP Location: Left Arm, Patient Position: Sitting)   Pulse 72   Ht 5\' 6"  (1.676 m)   Wt 146 lb (66.2 kg)   SpO2 98%   BMI 23.57 kg/m   Wt Readings from Last 3 Encounters:  12/03/23 146 lb (66.2 kg)  11/15/23 142 lb 12.8 oz (64.8 kg)  10/11/23 148 lb 3.2 oz (67.2 kg)    Physical Exam Vitals and nursing note reviewed.  Constitutional:      General: He is not in acute distress.    Appearance: Normal appearance. He is not ill-appearing, toxic-appearing or diaphoretic.  HENT:     Head: Normocephalic.     Right Ear: External ear normal.     Left Ear: External ear normal.     Nose: Nose normal. No congestion or rhinorrhea.     Mouth/Throat:     Mouth: Mucous membranes are moist.  Eyes:     General:        Right  eye: No discharge.        Left eye: No discharge.     Extraocular Movements: Extraocular movements intact.     Conjunctiva/sclera: Conjunctivae normal.     Pupils: Pupils are equal, round, and reactive to light.  Cardiovascular:     Rate and Rhythm: Normal rate and regular rhythm.     Heart sounds: No murmur heard. Pulmonary:     Effort: Pulmonary effort is normal. No respiratory distress.     Breath sounds: No wheezing, rhonchi or rales.  Abdominal:     General: Abdomen is flat. Bowel sounds are normal.  Musculoskeletal:     Cervical back: Normal range of motion and neck supple.  Skin:    General: Skin is warm and dry.     Capillary Refill: Capillary refill takes less than 2 seconds.  Neurological:     General: No focal deficit present.     Mental Status: He is alert and oriented to person, place, and time.  Psychiatric:        Mood and Affect: Mood normal.        Behavior: Behavior normal.  Thought Content: Thought content normal.        Judgment: Judgment normal.     Results for orders placed or performed in visit on 10/11/23  Comp Met (CMET)   Collection Time: 10/11/23 11:39 AM  Result Value Ref Range   Glucose 109 (H) 70 - 99 mg/dL   BUN 26 8 - 27 mg/dL   Creatinine, Ser 4.09 (H) 0.76 - 1.27 mg/dL   eGFR 42 (L) >81 XB/JYN/8.29   BUN/Creatinine Ratio 15 10 - 24   Sodium 136 134 - 144 mmol/L   Potassium 4.8 3.5 - 5.2 mmol/L   Chloride 100 96 - 106 mmol/L   CO2 22 20 - 29 mmol/L   Calcium 9.4 8.6 - 10.2 mg/dL   Total Protein 6.7 6.0 - 8.5 g/dL   Albumin 4.1 3.9 - 4.9 g/dL   Globulin, Total 2.6 1.5 - 4.5 g/dL   Bilirubin Total 0.3 0.0 - 1.2 mg/dL   Alkaline Phosphatase 62 44 - 121 IU/L   AST 19 0 - 40 IU/L   ALT 14 0 - 44 IU/L      Assessment & Plan:   Problem List Items Addressed This Visit       Respiratory   Emphysema lung (HCC) - Primary   Chronic.  Back to baseline.  Continue with Breztri and PRN albuterol use.  Follow up in 2 months. Call sooner  if concerns arise.           Follow up plan: Return in about 2 months (around 01/31/2024) for HTN, HLD, DM2 FU.

## 2023-12-06 ENCOUNTER — Other Ambulatory Visit: Payer: Self-pay | Admitting: Nurse Practitioner

## 2023-12-06 NOTE — Telephone Encounter (Signed)
 Requested Prescriptions  Refused Prescriptions Disp Refills   citalopram (CELEXA) 10 MG tablet [Pharmacy Med Name: CITALOPRAM HBR 10 MG TABLET] 60 tablet 0    Sig: TAKE 1 TABLET BY MOUTH DAILY     Psychiatry:  Antidepressants - SSRI Passed - 12/06/2023  5:30 PM      Passed - Completed PHQ-2 or PHQ-9 in the last 360 days      Passed - Valid encounter within last 6 months    Recent Outpatient Visits           1 month ago Pulmonary emphysema, unspecified emphysema type (HCC)   [place] [facility] Larae Grooms, NP   2 months ago Pulmonary emphysema, unspecified emphysema type [facility])   [place] [facility] Larae Grooms, NP   8 months ago Hyperlipidemia associated with type 2 diabetes mellitus [facility])   [place] [facility] Pearley, Sherran Needs, NP   11 months ago Pulmonary emphysema, unspecified emphysema type [facility])   [place] [facility] Larae Grooms, NP   1 year ago COPD exacerbation [facility])   [place] [facility] Larae Grooms, NP       Future Appointments             In 1 month Larae Grooms, NP [place] [facility]

## 2024-01-31 ENCOUNTER — Ambulatory Visit: Payer: HMO | Admitting: Nurse Practitioner

## 2024-01-31 NOTE — Progress Notes (Deleted)
 There were no vitals taken for this visit.   Subjective:    Patient ID: Terry French, male    DOB: [birth year], 73 y.o.   MRN: [id number]  HPI: Terry French is a 73 y.o. male  No chief complaint on file.  COPD Not well controlled.  Patient's symptoms started about a week ago.  Cough, SOB, wheezing, a lot of mucous COPD status: Not well controlled.  He is using his Breztri  and Albuterol . Satisfied with current treatment?: yes Oxygen use: no Dyspnea frequency: daily Cough frequency: daily Rescue inhaler frequency: a couple times a day right now Limitation of activity: no Productive cough: yes Last Spirometry:  Pneumovax: up to date Influenza: Not up to Date  CHRONIC KIDNEY DISEASE CKD status: controlled Medications renally dose: yes Previous renal evaluation: no Pneumovax:  Up to Date Influenza Vaccine:  Not up to Date  DIABETES Hypoglycemic episodes:no Polydipsia/polyuria: no Visual disturbance: no Chest pain: no Paresthesias: no Glucose Monitoring: no  Accucheck frequency: Not Checking  Fasting glucose:  Post prandial:  Evening:  Before meals: Taking Insulin?: no  Long acting insulin:  Short acting insulin: Blood Pressure Monitoring: not checking Retinal Examination: Not up to Date Foot Exam: Up to Date Diabetic Education: Not Completed Pneumovax: Up to Date Influenza: Up to Date Aspirin: no  DEPRESSION Patient states he does feel down, depressed and hopeless. He has been taking the Celexa  but it causes him to have diarrhea.  He would like to change to something else.  Does feel like it helps him feel less anxious.     Relevant past medical, surgical, family and social history reviewed and updated as indicated. Interim medical history since our last visit reviewed. Allergies and medications reviewed and updated.  Review of Systems  Constitutional:  Negative for fever.  HENT:  Positive for congestion.   Eyes:  Negative for visual disturbance.   Respiratory:  Positive for cough, chest tightness, shortness of breath and wheezing.   Cardiovascular:  Negative for chest pain, palpitations and leg swelling.  Endocrine: Negative for polydipsia and polyuria.  Neurological:  Negative for dizziness, light-headedness, numbness and headaches.    Per HPI unless specifically indicated above     Objective:    There were no vitals taken for this visit.  Wt Readings from Last 3 Encounters:  12/03/23 146 lb (66.2 kg)  11/15/23 142 lb 12.8 oz (64.8 kg)  10/11/23 148 lb 3.2 oz (67.2 kg)    Physical Exam Vitals and nursing note reviewed.  Constitutional:      General: He is not in acute distress.    Appearance: Normal appearance. He is not ill-appearing, toxic-appearing or diaphoretic.  HENT:     Head: Normocephalic.     Right Ear: External ear normal.     Left Ear: External ear normal.     Nose: Nose normal. No congestion or rhinorrhea.     Mouth/Throat:     Mouth: Mucous membranes are moist.  Eyes:     General:        Right eye: No discharge.        Left eye: No discharge.     Extraocular Movements: Extraocular movements intact.     Conjunctiva/sclera: Conjunctivae normal.     Pupils: Pupils are equal, round, and reactive to light.  Cardiovascular:     Rate and Rhythm: Normal rate and regular rhythm.     Heart sounds: No murmur heard. Pulmonary:     Effort: Pulmonary effort is normal.  No respiratory distress.     Breath sounds: Wheezing present. No rhonchi or rales.  Abdominal:     General: Abdomen is flat. Bowel sounds are normal.  Musculoskeletal:     Cervical back: Normal range of motion and neck supple.  Skin:    General: Skin is warm and dry.     Capillary Refill: Capillary refill takes less than 2 seconds.  Neurological:     General: No focal deficit present.     Mental Status: He is alert and oriented to person, place, and time.  Psychiatric:        Mood and Affect: Mood normal.        Behavior: Behavior normal.         Thought Content: Thought content normal.        Judgment: Judgment normal.     Results for orders placed or performed in visit on 10/11/23  Comp Met (CMET)   Collection Time: 10/11/23 11:39 AM  Result Value Ref Range   Glucose 109 (H) 70 - 99 mg/dL   BUN 26 8 - 27 mg/dL   Creatinine, Ser 5.78 (H) 0.76 - 1.27 mg/dL   eGFR 42 (L) >46 NG/EXB/2.84   BUN/Creatinine Ratio 15 10 - 24   Sodium 136 134 - 144 mmol/L   Potassium 4.8 3.5 - 5.2 mmol/L   Chloride 100 96 - 106 mmol/L   CO2 22 20 - 29 mmol/L   Calcium  9.4 8.6 - 10.2 mg/dL   Total Protein 6.7 6.0 - 8.5 g/dL   Albumin 4.1 3.9 - 4.9 g/dL   Globulin, Total 2.6 1.5 - 4.5 g/dL   Bilirubin Total 0.3 0.0 - 1.2 mg/dL   Alkaline Phosphatase 62 44 - 121 IU/L   AST 19 0 - 40 IU/L   ALT 14 0 - 44 IU/L      Assessment & Plan:   Problem List Items Addressed This Visit       Cardiovascular and Mediastinum   Atherosclerosis of aorta (HCC)   Chronic. Continue with Crestor .  Labs ordered today.        Relevant Orders   Lipid Profile     Respiratory   Emphysema lung (HCC) - Primary   Chronic, ongoing. Continue taking Breztri  daily and albuterol  PRN. Refills given today. Referral for lung cancer screening done.       Relevant Medications   Acetaminophen-guaiFENesin (MUCINEX COLD & FLU PO)   Other Relevant Orders   Ambulatory Referral Lung Cancer Screening [facility]   COPD with acute exacerbation (HCC)   Exacerbated at visit today.  Wheezing on exam.  Duoneb done in office today.  Solumedrol 40mg  given in office.  Will treat with azithromycin .  Did not tolerate Oral Prednisone  in the past.  Follow up in 1 week.  Call sooner if concerns arise.       Relevant Medications   Acetaminophen-guaiFENesin (MUCINEX COLD & FLU PO)     Endocrine   Diabetes type 2, controlled (HCC)   Chronic, ongoing. A1C 6.5 today. Diet controlled, not currently on medication regimen. Continue heart healthy diet and exercise of 150 mins  weekly. Needs Eye exam and ARB for kidney protection.  Recommend scheduling eye exam with ophthalmologist.       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  Controlled.  Continue with current medication regimen.  Needs ACE/ARB.  Will likely start ARB due to COPD at next visit.  Labs ordered today.  Return to clinic in 6 months for reevaluation.  Call sooner if concerns arise.          Follow up plan: No follow-ups on file.

## 2024-02-01 ENCOUNTER — Ambulatory Visit

## 2024-02-09 ENCOUNTER — Other Ambulatory Visit: Payer: Self-pay | Admitting: Nurse Practitioner

## 2024-02-12 NOTE — Telephone Encounter (Signed)
 Requested Prescriptions  Pending Prescriptions Disp Refills   sertraline  (ZOLOFT ) 25 MG tablet [Pharmacy Med Name: SERTRALINE  HCL 25 MG TABLET] 90 tablet 0    Sig: TAKE 1 TABLET BY MOUTH DAILY     Psychiatry:  Antidepressants - SSRI - sertraline  Passed - 02/12/2024  9:11 AM      Passed - AST in normal range and within 360 days    AST  Date Value Ref Range Status  10/11/2023 19 0 - 40 IU/L Final         Passed - ALT in normal range and within 360 days    ALT  Date Value Ref Range Status  10/11/2023 14 0 - 44 IU/L Final         Passed - Completed PHQ-2 or PHQ-9 in the last 360 days      Passed - Valid encounter within last 6 months    Recent Outpatient Visits           2 months ago Pulmonary emphysema, unspecified emphysema type [facility])   [place] [facility] Aileen Alexanders, NP   2 months ago 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

## 2024-02-13 ENCOUNTER — Encounter: Payer: Self-pay | Admitting: Nurse Practitioner

## 2024-02-13 ENCOUNTER — Ambulatory Visit (INDEPENDENT_AMBULATORY_CARE_PROVIDER_SITE_OTHER): Admitting: Nurse Practitioner

## 2024-02-13 VITALS — BP 109/68 | HR 67 | Temp 98.7°F | Resp 16 | Ht 65.98 in | Wt 150.6 lb

## 2024-02-13 DIAGNOSIS — N1831 Chronic kidney disease, stage 3a: Secondary | ICD-10-CM

## 2024-02-13 DIAGNOSIS — J439 Emphysema, unspecified: Secondary | ICD-10-CM

## 2024-02-13 DIAGNOSIS — N183 Chronic kidney disease, stage 3 unspecified: Secondary | ICD-10-CM | POA: Diagnosis not present

## 2024-02-13 DIAGNOSIS — Z1330 Encounter for screening examination for mental health and behavioral disorders, unspecified: Secondary | ICD-10-CM | POA: Diagnosis not present

## 2024-02-13 DIAGNOSIS — E1122 Type 2 diabetes mellitus with diabetic chronic kidney disease: Secondary | ICD-10-CM

## 2024-02-13 DIAGNOSIS — F322 Major depressive disorder, single episode, severe without psychotic features: Secondary | ICD-10-CM

## 2024-02-13 DIAGNOSIS — I700 Atherosclerosis of aorta: Secondary | ICD-10-CM

## 2024-02-13 MED ORDER — ALBUTEROL SULFATE HFA 108 (90 BASE) MCG/ACT IN AERS: INHALATION_SPRAY | RESPIRATORY_TRACT | 1 refills | Status: DC

## 2024-02-13 MED ORDER — ROSUVASTATIN CALCIUM 20 MG PO TABS: 20.0000 mg | ORAL_TABLET | Freq: Every day | ORAL | 1 refills | Status: DC

## 2024-02-13 NOTE — Assessment & Plan Note (Signed)
 Chronic.  Controlled.  Continue with current medication regimen of Breztri  and PRN albuterol .  Labs ordered today.  Return to clinic in 6 months for reevaluation.  Call sooner if concerns arise.

## 2024-02-13 NOTE — Assessment & Plan Note (Signed)
 Chronic.  Controlled.  Last A1c was 6.5%.  On Statin therapy.  Would benefit from ACE/ARB.  Continue with current medication regimen.  Labs ordered today.  Return to clinic in 6 months for reevaluation.  Call sooner if concerns arise.

## 2024-02-13 NOTE — Assessment & Plan Note (Signed)
 Chronic.  Controlled.  Continue with current medication regimen.  Labs ordered today.  Return to clinic in 6 months for reevaluation.  Call sooner if concerns arise.  ? ?

## 2024-02-13 NOTE — Assessment & Plan Note (Signed)
 Chronic. Increased Rosuvastatin  dose to 20mg  daily.  Labs ordered today.  Follow up in 6 months.  Call sooner if concerns arise.

## 2024-02-13 NOTE — Progress Notes (Signed)
 BP 109/68 (BP Location: Left Arm, Patient Position: Sitting, Cuff Size: Normal)   Pulse 67   Temp 98.7 F (37.1 C) (Oral)   Resp 16   Ht 5' 5.98" (1.676 m)   Wt 150 lb 9.6 oz (68.3 kg)   SpO2 96%   BMI 24.32 kg/m    Subjective:    Patient ID: Terry French, male    DOB: 08-31-1951, 73 y.o.   MRN: [id number]  HPI: Terry French is a 73 y.o. male  Chief Complaint  Patient presents with   Hypertension    No issues    Hyperlipidemia   COPD    Once flare resolved been feeling much better.    Diabetes    They are unaware of this diagnosis and states he is not.    Depression    About the same no current concern.    COPD COPD status: stable  He is using his Breztri  and Albuterol . Satisfied with current treatment?: yes Oxygen use: no Dyspnea frequency: daily Cough frequency: daily Rescue inhaler frequency: a couple times a day right now Limitation of activity: no Productive cough: yes Last Spirometry:  Pneumovax: up to date Influenza: Not up to Date  CHRONIC KIDNEY DISEASE CKD status: controlled Medications renally dose: yes Previous renal evaluation: no Pneumovax:  Up to Date Influenza Vaccine:  Not up to Date  DIABETES Hypoglycemic episodes:no Polydipsia/polyuria: no Visual disturbance: no Chest pain: no Paresthesias: no Glucose Monitoring: no  Accucheck frequency: Not Checking  Fasting glucose:  Post prandial:  Evening:  Before meals: Taking Insulin?: no  Long acting insulin:  Short acting insulin: Blood Pressure Monitoring: not checking Retinal Examination: Not up to Date Foot Exam: Up to Date Diabetic Education: Not Completed Pneumovax: Up to Date Influenza: Up to Date Aspirin: no  DEPRESSION Patient states he does feel down, depressed and hopeless. He has been taking the Celexa  but it causes him to have diarrhea.  He would like to change to something else.  Does feel like it helps him feel less anxious.        02/13/2024    2:24 PM 11/15/2023     2:22 PM 10/11/2023   11:39 AM  PHQ9 SCORE ONLY  PHQ-9 Total Score 11 20 20       02/13/2024    2:24 PM 11/15/2023    2:22 PM 10/11/2023   11:39 AM 10/04/2023    3:20 PM  GAD 7 : Generalized Anxiety Score  Nervous, Anxious, on Edge 3 2 3 2   Control/stop worrying 2 3 3 2   Worry too much - different things 3 3 3 2   Trouble relaxing 1 2 3 3   Restless 3 3 3 3   Easily annoyed or irritable 0 3 3 3   Afraid - awful might happen 3 0 3 1  Total GAD 7 Score 15 16 21 16   Anxiety Difficulty Very difficult          Relevant past medical, surgical, family and social history reviewed and updated as indicated. Interim medical history since our last visit reviewed. Allergies and medications reviewed and updated.  Review of Systems  Eyes:  Negative for visual disturbance.  Respiratory:  Negative for chest tightness and shortness of breath.   Cardiovascular:  Negative for chest pain, palpitations and leg swelling.  Endocrine: Negative for polydipsia and polyuria.  Neurological:  Negative for dizziness, light-headedness, numbness and headaches.  Psychiatric/Behavioral:  Positive for dysphoric mood. Negative for suicidal ideas. The patient is nervous/anxious.  Per HPI unless specifically indicated above     Objective:    BP 109/68 (BP Location: Left Arm, Patient Position: Sitting, Cuff Size: Normal)   Pulse 67   Temp 98.7 F (37.1 C) (Oral)   Resp 16   Ht 5' 5.98" (1.676 m)   Wt 150 lb 9.6 oz (68.3 kg)   SpO2 96%   BMI 24.32 kg/m   Wt Readings from Last 3 Encounters:  02/13/24 150 lb 9.6 oz (68.3 kg)  12/03/23 146 lb (66.2 kg)  11/15/23 142 lb 12.8 oz (64.8 kg)    Physical Exam Vitals and nursing note reviewed.  Constitutional:      General: He is not in acute distress.    Appearance: Normal appearance. He is not ill-appearing, toxic-appearing or diaphoretic.  HENT:     Head: Normocephalic.     Right Ear: External ear normal.     Left Ear: External ear normal.     Nose: Nose  normal. No congestion or rhinorrhea.     Mouth/Throat:     Mouth: Mucous membranes are moist.  Eyes:     General:        Right eye: No discharge.        Left eye: No discharge.     Extraocular Movements: Extraocular movements intact.     Conjunctiva/sclera: Conjunctivae normal.     Pupils: Pupils are equal, round, and reactive to light.  Cardiovascular:     Rate and Rhythm: Normal rate and regular rhythm.     Heart sounds: No murmur heard. Pulmonary:     Effort: Pulmonary effort is normal. No respiratory distress.     Breath sounds: No wheezing, rhonchi or rales.  Abdominal:     General: Abdomen is flat. Bowel sounds are normal.  Musculoskeletal:     Cervical back: Normal range of motion and neck supple.  Skin:    General: Skin is warm and dry.     Capillary Refill: Capillary refill takes less than 2 seconds.  Neurological:     General: No focal deficit present.     Mental Status: He is alert and oriented to person, place, and time.  Psychiatric:        Mood and Affect: Mood normal.        Behavior: Behavior normal.        Thought Content: Thought content normal.        Judgment: Judgment normal.     Results for orders placed or performed in visit on 10/11/23  Comp Met (CMET)   Collection Time: 10/11/23 11:39 AM  Result Value Ref Range   Glucose 109 (H) 70 - 99 mg/dL   BUN 26 8 - 27 mg/dL   Creatinine, Ser 1.61 (H) 0.76 - 1.27 mg/dL   eGFR 42 (L) >09 UE/AVW/0.98   BUN/Creatinine Ratio 15 10 - 24   Sodium 136 134 - 144 mmol/L   Potassium 4.8 3.5 - 5.2 mmol/L   Chloride 100 96 - 106 mmol/L   CO2 22 20 - 29 mmol/L   Calcium  9.4 8.6 - 10.2 mg/dL   Total Protein 6.7 6.0 - 8.5 g/dL   Albumin 4.1 3.9 - 4.9 g/dL   Globulin, Total 2.6 1.5 - 4.5 g/dL   Bilirubin Total 0.3 0.0 - 1.2 mg/dL   Alkaline Phosphatase 62 44 - 121 IU/L   AST 19 0 - 40 IU/L   ALT 14 0 - 44 IU/L      Assessment & Plan:   Problem  List Items Addressed This Visit       Cardiovascular and  Mediastinum   Atherosclerosis of aorta (HCC)   Chronic. Continue with Crestor .  Labs ordered today.        Relevant Orders   Lipid Profile     Respiratory   Emphysema lung (HCC) - Primary   Chronic, ongoing. Continue taking Breztri  daily and albuterol  PRN. Refills given today. Referral for lung cancer screening done.       Relevant Medications   Acetaminophen-guaiFENesin (MUCINEX COLD & FLU PO)   Other Relevant Orders   Ambulatory Referral Lung Cancer Screening [facility]   COPD with acute exacerbation (HCC)   Exacerbated at visit today.  Wheezing on exam.  Duoneb done in office today.  Solumedrol 40mg  given in office.  Will treat with azithromycin .  Did not tolerate Oral Prednisone  in the past.  Follow up in 1 week.  Call sooner if concerns arise.       Relevant Medications   Acetaminophen-guaiFENesin (MUCINEX COLD & FLU PO)     Endocrine   Diabetes type 2, controlled (HCC)   Chronic, ongoing. A1C 6.5 today. Diet controlled, not currently on medication regimen. Continue heart healthy diet and exercise of 150 mins weekly. Needs Eye exam and ARB for kidney protection.  Recommend scheduling eye exam with ophthalmologist.       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  Controlled.  Continue with current medication regimen.  Needs ACE/ARB.  Will likely start ARB due to COPD at next visit.  Labs ordered today.  Return to clinic in 6 months for reevaluation.  Call sooner if concerns arise.          Follow up plan: Return in about 6 months (around 08/15/2024) for HTN, HLD, DM2 FU.

## 2024-02-13 NOTE — Assessment & Plan Note (Signed)
 Chronic.  Ongoing concern.  Does not want to take medication at this time.  Labs ordered today.  Return to clinic in 6 months for reevaluation.  Call sooner if concerns arise.

## 2024-02-14 ENCOUNTER — Encounter: Payer: Self-pay | Admitting: Nurse Practitioner

## 2024-02-14 LAB — LIPID PANEL
Chol/HDL Ratio: 3.3 ratio (ref 0.0–5.0)
Cholesterol, Total: 140 mg/dL (ref 100–199)
HDL: 42 mg/dL (ref >39)
LDL Chol Calc (NIH): 79 mg/dL (ref 0–99)
Triglycerides: 100 mg/dL (ref 0–149)
VLDL Cholesterol Cal: 19 mg/dL (ref 5–40)

## 2024-02-14 LAB — COMPREHENSIVE METABOLIC PANEL WITH GFR
ALT: 12 IU/L (ref 0–44)
AST: 14 IU/L (ref 0–40)
Albumin: 4.2 g/dL (ref 3.9–4.9)
Alkaline Phosphatase: 68 IU/L (ref 44–121)
BUN/Creatinine Ratio: 10 (ref 10–24)
BUN: 14 mg/dL (ref 8–27)
Bilirubin Total: 0.3 mg/dL (ref 0.0–1.2)
CO2: 18 mmol/L — ABNORMAL LOW (ref 20–29)
Calcium: 9 mg/dL (ref 8.6–10.2)
Chloride: 103 mmol/L (ref 96–106)
Creatinine, Ser: 1.42 mg/dL — ABNORMAL HIGH (ref 0.76–1.27)
Globulin, Total: 2.7 g/dL (ref 1.5–4.5)
Glucose: 92 mg/dL (ref 70–99)
Potassium: 4.7 mmol/L (ref 3.5–5.2)
Sodium: 137 mmol/L (ref 134–144)
Total Protein: 6.9 g/dL (ref 6.0–8.5)
eGFR: 53 mL/min/{1.73_m2} — ABNORMAL LOW (ref >59)

## 2024-02-14 LAB — HEMOGLOBIN A1C
Est. average glucose Bld gHb Est-mCnc: 128 mg/dL
Hgb A1c MFr Bld: 6.1 % — ABNORMAL HIGH (ref 4.8–5.6)

## 2024-03-11 ENCOUNTER — Other Ambulatory Visit: Payer: Self-pay | Admitting: Nurse Practitioner

## 2024-03-11 MED ORDER — BREZTRI AEROSPHERE 160-9-4.8 MCG/ACT IN AERO: 2.0000 | INHALATION_SPRAY | Freq: Two times a day (BID) | RESPIRATORY_TRACT | 2 refills | Status: DC

## 2024-03-12 ENCOUNTER — Other Ambulatory Visit: Payer: Self-pay | Admitting: Nurse Practitioner

## 2024-03-13 NOTE — Telephone Encounter (Signed)
 Requested Prescriptions  Refused Prescriptions Disp Refills   rosuvastatin  (CRESTOR ) 10 MG tablet [Pharmacy Med Name: ROSUVASTATIN  CALCIUM  10 MG TAB] 90 tablet 1    Sig: TAKE 1 TABLET BY MOUTH DAILY     Cardiovascular:  Antilipid - Statins 2 Failed - 03/13/2024 10:16 AM      Failed - Cr in normal range and within 360 days    Creatinine, Ser  Date Value Ref Range Status  02/13/2024 1.42 (H) 0.76 - 1.27 mg/dL Final         Failed - Lipid Panel in normal range within the last 12 months    Cholesterol, Total  Date Value Ref Range Status  02/13/2024 140 100 - 199 mg/dL Final   LDL Chol Calc (NIH)  Date Value Ref Range Status  02/13/2024 79 0 - 99 mg/dL Final   HDL  Date Value Ref Range Status  02/13/2024 42 >39 mg/dL Final   Triglycerides  Date Value Ref Range Status  02/13/2024 100 0 - 149 mg/dL Final         Passed - Patient is not pregnant      Passed - Valid encounter within last 12 months    Recent Outpatient Visits           4 weeks ago Atherosclerosis of aorta [facility])   [place] [facility] Aileen Alexanders, NP   3 months ago Pulmonary emphysema, unspecified emphysema type [facility])   [place] [facility] Aileen Alexanders, NP   3 months ago Moderately severe major depression [facility])   [place] [facility] Aileen Alexanders, NP

## 2024-05-08 ENCOUNTER — Ambulatory Visit: Payer: Self-pay

## 2024-05-08 VITALS — Ht 66.0 in | Wt 145.0 lb

## 2024-05-08 DIAGNOSIS — Z0000 Encounter for general adult medical examination without abnormal findings: Secondary | ICD-10-CM | POA: Diagnosis not present

## 2024-05-08 NOTE — Patient Instructions (Addendum)
 [name] , Thank you for taking time out of your busy schedule to complete your Annual Wellness Visit with me. I enjoyed our conversation and look forward to speaking with you again next year. I, as well as your care team,  appreciate your ongoing commitment to your health goals. Please review the following plan we discussed and let me know if I can assist you in the future. Your Game plan/ To Do List    Referrals: None   Follow up Visits: We will see or speak with you next year for your Next Medicare AWV with our clinical staff on 05/21/25 @ 11:20am Have you seen your provider in the last 6 months (3 months if uncontrolled diabetes)? Yes  Clinician Recommendations: Recommend getting a diabetic eye exam every year. I have included a list of eye doctors in the area. You are due for a colonoscopy around 07/05/24. Aim for 30 minutes of exercise or brisk walking, 6-8 glasses of water, and 5 servings of fruits and vegetables each day.        This is a list of the screenings recommended for you:  Health Maintenance  Topic Date Due   Eye exam for diabetics  Never done   DTaP/Tdap/Td vaccine (1 - Tdap) Never done   Screening for Lung Cancer  Never done   COVID-19 Vaccine (1 - 2024-25 season) Never done   Complete foot exam   10/31/2023   Colon Cancer Screening  07/05/2024   Zoster (Shingles) Vaccine (1 of 2) 05/15/2024*   Flu Shot  05/09/2024   Hemoglobin A1C  08/15/2024   Yearly kidney health urinalysis for diabetes  10/03/2024   Yearly kidney function blood test for diabetes  02/12/2025   Medicare Annual Wellness Visit  05/08/2025   Pneumococcal Vaccine for age over 12  Completed   Hepatitis C Screening  Completed   Hepatitis B Vaccine  Aged Out   HPV Vaccine  Aged Out   Meningitis B Vaccine  Aged Out  *Topic was postponed. The date shown is not the original due date.    Advanced directives: (Provided) Advance directive discussed with you today. I have provided a copy for you to  complete at home and have notarized. Once this is complete, please bring a copy in to our office so we can scan it into your chart.  Advance Care Planning is important because it:  [x]  Makes sure you receive the medical care that is consistent with your values, goals, and preferences  [x]  It provides guidance to your family and loved ones and reduces their decisional burden about whether or not they are making the right decisions based on your wishes.  Follow the link provided in your after visit summary or read over the paperwork we have mailed to you to help you started getting your Advance Directives in place. If you need assistance in completing these, please reach out to us  so that we can help you!   There are several Eye Doctors in your area. Here are a few that usually accept all insurance types:  [facility] [address] [place], [place] [address] Phone: [phone]  [facility] [address] [place], [place] [address] Phone: [phone]  [facility] [address] [place], [place] [address] Phone: [phone]  [facility] [address] [place], [place] [address] Phone: [phone]  [facility] [address] [place], [place] [address] Phone: [phone]  [facility] [address] [place], [place]  [address] Phone: [phone]  Please let us  know if you require a referral for an eye exam appointment. Thank you!  See attachments for Preventive Care and Fall Prevention Tips.   Fall Prevention in the Home, Adult Falls can cause injuries and affect people of all ages. There are many simple things that you can do to make your home safe and to help prevent falls. If you need it, ask for help making these changes. What actions can I take to prevent falls? General information Use good lighting in all rooms. Make sure to: Replace any light bulbs that burn out. Turn on lights if it is dark and use night-lights. Keep items that you use often in  easy-to-reach places. Lower the shelves around your home if needed. Move furniture so that there are clear paths around it. Do not keep throw rugs or other things on the floor that can make you trip. If any of your floors are uneven, fix them. Add color or contrast paint or tape to clearly mark and help you see: Grab bars or handrails. First and last steps of staircases. Where the edge of each step is. If you use a ladder or stepladder: Make sure that it is fully opened. Do not climb a closed ladder. Make sure the sides of the ladder are locked in place. Have someone hold the ladder while you use it. Know where your pets are as you move through your home. What can I do in the bathroom?     Keep the floor dry. Clean up any water that is on the floor right away. Remove soap buildup in the bathtub or shower. Buildup makes bathtubs and showers slippery. Use non-skid mats or decals on the floor of the bathtub or shower. Attach bath mats securely with double-sided, non-slip rug tape. If you need to sit down while you are in the shower, use a non-slip stool. Install grab bars by the toilet and in the bathtub and shower. Do not use towel bars as grab bars. What can I do in the bedroom? Make sure that you have a light by your bed that is easy to reach. Do not use any sheets or blankets on your bed that hang to the floor. Have a firm bench or chair with side arms that you can use for support when you get dressed. What can I do in the kitchen? Clean up any spills right away. If you need to reach something above you, use a sturdy step stool that has a grab bar. Keep electrical cables out of the way. Do not use floor polish or wax that makes floors slippery. What can I do with my stairs? Do not leave anything on the stairs. Make sure that you have a light switch at the top and the bottom of the stairs. Have them installed if you do not have them. Make sure that there are handrails on both sides  of the stairs. Fix handrails that are broken or loose. Make sure that handrails are as long as the staircases. Install non-slip stair treads on all stairs in your home if they do not have carpet. Avoid having throw rugs at the top or bottom of stairs, or secure the rugs with carpet tape to prevent them from moving. Choose a carpet design that does not hide the edge of steps on the stairs. Make sure that carpet is firmly attached to the stairs. Fix any carpet that is loose or worn. What can I do on the  outside of my home? Use bright outdoor lighting. Repair the edges of walkways and driveways and fix any cracks. Clear paths of anything that can make you trip, such as tools or rocks. Add color or contrast paint or tape to clearly mark and help you see high doorway thresholds. Trim any bushes or trees on the main path into your home. Check that handrails are securely fastened and in good repair. Both sides of all steps should have handrails. Install guardrails along the edges of any raised decks or porches. Have leaves, snow, and ice cleared regularly. Use sand, salt, or ice melt on walkways during winter months if you live where there is ice and snow. In the garage, clean up any spills right away, including grease or oil spills. What other actions can I take? Review your medicines with your health care provider. Some medicines can make you confused or feel dizzy. This can increase your chance of falling. Wear closed-toe shoes that fit well and support your feet. Wear shoes that have rubber soles and low heels. Use a cane, walker, scooter, or crutches that help you move around if needed. Talk with your provider about other ways that you can decrease your risk of falls. This may include seeing a physical therapist to learn to do exercises to improve movement and strength. Where to find more information Centers for Disease Control and Prevention, [employer]: TonerPromos.no [employer] on Aging:  BaseRingTones.pl National Institute on Aging: BaseRingTones.pl Contact a health care provider if: You are afraid of falling at home. You feel weak, drowsy, or dizzy at home. You fall at home. Get help right away if you: Lose consciousness or have trouble moving after a fall. Have a fall that causes a head injury. These symptoms may be an emergency. Get help right away. Call 911. Do not wait to see if the symptoms will go away. Do not drive yourself to the hospital. This information is not intended to replace advice given to you by your health care provider. Make sure you discuss any questions you have with your health care provider. Document Revised: 05/29/2022 Document Reviewed: 05/29/2022 Elsevier Patient Education  2024 [employer].

## 2024-05-08 NOTE — Progress Notes (Signed)
 Subjective:   Terry French is a 73 y.o. who presents for a Medicare Wellness preventive visit.  As a reminder, Annual Wellness Visits don't include a physical exam, and some assessments may be limited, especially if this visit is performed virtually. We may recommend an in-person follow-up visit with your provider if needed.  Visit Complete: Virtual I connected with  [name] on 05/08/24 by a audio enabled telemedicine application and verified that I am speaking with the correct person using two identifiers.  Patient Location: Home  Provider Location: Home Office  I discussed the limitations of evaluation and management by telemedicine. The patient expressed understanding and agreed to proceed.  Vital Signs: Because this visit was a virtual/telehealth visit, some criteria may be missing or patient reported. Any vitals not documented were not able to be obtained and vitals that have been documented are patient reported.  VideoDeclined- This patient declined Librarian, academic. Therefore the visit was completed with audio only.  Persons Participating in Visit: Patient.  AWV Questionnaire: No: Patient Medicare AWV questionnaire was not completed prior to this visit.  Cardiac Risk Factors include: advanced age (>46men, >76 women);male gender;diabetes mellitus;smoking/ tobacco exposure     Objective:    Today's Vitals   05/08/24 1119 05/08/24 1120  Weight: 145 lb (65.8 kg)   Height: 5' 6 (1.676 m)   PainSc:  7    Body mass index is 23.4 kg/m.     05/08/2024   11:35 AM 05/01/2023    1:07 PM 07/05/2022    8:01 AM 04/25/2022   12:23 PM 01/14/2021   11:27 AM  Advanced Directives  Does Patient Have a Medical Advance Directive? No No No No No  Would patient like information on creating a medical advance directive? Yes (MAU/Ambulatory/Procedural Areas - Information given) No - Patient declined No - Patient declined No - Patient declined No - Patient  declined    Current Medications (verified) Outpatient Encounter Medications as of 05/08/2024  Medication Sig   albuterol  (VENTOLIN  HFA) 108 (90 Base) MCG/ACT inhaler INHALE 1 TO 2 PUFFS INTO THE LUNGS EVERY 4 HOURS AS NEEDED FOR WHEEZING   BREZTRI  AEROSPHERE 160-9-4.8 MCG/ACT AERO inhaler Inhale 2 puffs into the lungs in the morning and at bedtime.   rosuvastatin  (CRESTOR ) 20 MG tablet Take 1 tablet (20 mg total) by mouth daily.   No facility-administered encounter medications on file as of 05/08/2024.    Allergies (verified) Patient has no known allergies.   History: Past Medical History:  Diagnosis Date   Asthma    COPD (chronic obstructive pulmonary disease) (HCC)    Emphysema lung (HCC)    Past Surgical History:  Procedure Laterality Date   BACK SURGERY     COLONOSCOPY WITH PROPOFOL  N/A 07/05/2022   Procedure: COLONOSCOPY WITH PROPOFOL ;  Surgeon: Unk Corinn Skiff, MD;  Location: [facility] ENDOSCOPY;  Service: Gastroenterology;  Laterality: N/A;   Family History  Problem Relation Age of Onset   Cancer Mother    COPD Father    Heart attack Brother    Diabetes Brother    Social History   Socioeconomic History   Marital status: Divorced    Spouse name: Not on file   Number of children: 2   Years of education: Not on file   Highest education level: Not on file  Occupational History   Occupation: retired  Tobacco Use   Smoking status: Every Day    Current packs/day: 1.00    Average packs/day:  1 pack/day for 58.6 years (58.6 ttl pk-yrs)    Types: Cigarettes    Start date: 60   Smokeless tobacco: Never  Vaping Use   Vaping status: Never Used  Substance and Sexual Activity   Alcohol use: No    Comment: former alcoholic   Drug use: Yes    Frequency: 14.0 times per week    Types: Marijuana    Comment: cocaine use in the past, uses marijuana twice a day   Sexual activity: Yes    Birth control/protection: None, Condom  Other Topics Concern   Not on file  Social  History Narrative   Not on file   Social Drivers of Health   Financial Resource Strain: Low Risk  (05/08/2024)   Overall Financial Resource Strain (CARDIA)    Difficulty of Paying Living Expenses: Not hard at all  Food Insecurity: No Food Insecurity (05/08/2024)   Hunger Vital Sign    Worried About Running Out of Food in the Last Year: Never true    Ran Out of Food in the Last Year: Never true  Transportation Needs: No Transportation Needs (05/08/2024)   PRAPARE - Administrator, Civil Service (Medical): No    Lack of Transportation (Non-Medical): No  Physical Activity: Insufficiently Active (05/08/2024)   Exercise Vital Sign    Days of Exercise per Week: 7 days    Minutes of Exercise per Session: 10 min  Stress: Stress Concern Present (05/08/2024)   [employer] of Occupational Health - Occupational Stress Questionnaire    Feeling of Stress: Very much  Social Connections: Socially Isolated (05/08/2024)   Social Connection and Isolation Panel    Frequency of Communication with Friends and Family: Once a week    Frequency of Social Gatherings with Friends and Family: More than three times a week    Attends Religious Services: Never    Database administrator or Organizations: No    Attends Engineer, structural: Never    Marital Status: Divorced    Tobacco Counseling Ready to quit: Not Answered Counseling given: Not Answered    Clinical Intake:  Pre-visit preparation completed: Yes  Pain : 0-10 Pain Score: 7  Pain Type: Chronic pain Pain Location: Back Pain Descriptors / Indicators: Aching     BMI - recorded: 23.4 Nutritional Status: BMI of 19-24  Normal Nutritional Risks: None Diabetes: Yes CBG done?: No Did pt. bring in CBG monitor from home?: No  Lab Results  Component Value Date   HGBA1C 6.1 (H) 02/13/2024   HGBA1C 6.5 (H) 10/04/2023   HGBA1C 6.5 (H) 04/02/2023     How often do you need to have someone help you when you read  instructions, pamphlets, or other written materials from your doctor or pharmacy?: 5 - Always (cannot read or write) What is the last grade level you completed in school?: 10th special education class  Interpreter Needed?: No  Information entered by :: Vina Ned, CMA   Activities of Daily Living     05/08/2024   11:24 AM  In your present state of health, do you have any difficulty performing the following activities:  Hearing? 0  Vision? 0  Difficulty concentrating or making decisions? 0  Walking or climbing stairs? 1  Comment due to chronic back pain  Dressing or bathing? 0  Doing errands, shopping? 0  Preparing Food and eating ? N  Using the Toilet? N  In the past six months, have you accidently leaked urine? N  Do you have problems with loss of bowel control? N  Managing your Medications? N  Managing your Finances? N  Housekeeping or managing your Housekeeping? N    Patient Care Team: Melvin Pao, NP as PCP - General (Nurse Practitioner)  I have updated your Care Teams any recent Medical Services you may have received from other providers in the past year.     Assessment:   This is a routine wellness examination for Victor.  Hearing/Vision screen Hearing Screening - Comments:: Denies hearing loss  Vision Screening - Comments:: Needs DM eye exam, included list of eye doctors in AVS   Goals Addressed             This Visit's Progress    Patient Stated       No goals       Depression Screen     05/08/2024   11:31 AM 02/13/2024    2:24 PM 11/15/2023    2:22 PM 10/11/2023   11:39 AM 10/04/2023    3:20 PM 05/01/2023    1:05 PM 12/04/2022    1:27 PM  PHQ 2/9 Scores  PHQ - 2 Score 0 4 6 5 4  0 5  PHQ- 9 Score 4 11 20 20 15  0 8    Fall Risk     05/08/2024   11:37 AM 02/13/2024    2:24 PM 05/01/2023    1:07 PM 10/30/2022    2:50 PM 09/14/2022    2:18 PM  Fall Risk   Falls in the past year? 0 0 1 0 1  Number falls in past yr: 0 0 0 0 1  Injury with Fall?  0 0 0 0 0  Risk for fall due to : No Fall Risks No Fall Risks History of fall(s) No Fall Risks History of fall(s)  Follow up Falls evaluation completed Falls evaluation completed Falls evaluation completed;Falls prevention discussed Falls evaluation completed  Falls evaluation completed      Data saved with a previous flowsheet row definition    MEDICARE RISK AT HOME:  Medicare Risk at Home Any stairs in or around the home?: Yes If so, are there any without handrails?: No Home free of loose throw rugs in walkways, pet beds, electrical cords, etc?: Yes Adequate lighting in your home to reduce risk of falls?: Yes Life alert?: No Use of a cane, walker or w/c?: No Grab bars in the bathroom?: Yes Shower chair or bench in shower?: No Elevated toilet seat or a handicapped toilet?: No  TIMED UP AND GO:  Was the test performed?  No  Cognitive Function: 6CIT completed        05/08/2024   11:39 AM 05/01/2023    1:11 PM 04/25/2022   12:21 PM  6CIT Screen  What Year? 0 points 0 points 4 points  What month? 3 points 0 points 0 points  What time? 0 points 0 points 0 points  Count back from 20 0 points 0 points 4 points  Months in reverse 0 points 4 points 4 points  Repeat phrase 8 points 8 points 2 points  Total Score 11 points 12 points 14 points    Immunizations Immunization History  Administered Date(s) Administered   Fluad Quad(high Dose 65+) 07/06/2022   PNEUMOCOCCAL CONJUGATE-20 04/05/2022    Screening Tests Health Maintenance  Topic Date Due   OPHTHALMOLOGY EXAM  Never done   DTaP/Tdap/Td (1 - Tdap) Never done   Lung Cancer Screening  Never done   COVID-19 Vaccine (  1 - 2024-25 season) Never done   FOOT EXAM  10/31/2023   Colonoscopy  07/05/2024   Zoster Vaccines- Shingrix (1 of 2) 05/15/2024 (Originally 05/19/2003)   INFLUENZA VACCINE  05/09/2024   HEMOGLOBIN A1C  08/15/2024   Diabetic kidney evaluation - Urine ACR  10/03/2024   Diabetic kidney evaluation - eGFR  measurement  02/12/2025   Medicare Annual Wellness (AWV)  05/08/2025   Pneumococcal Vaccine: 50+ Years  Completed   Hepatitis C Screening  Completed   Hepatitis B Vaccines  Aged Out   HPV VACCINES  Aged Out   Meningococcal B Vaccine  Aged Out    Health Maintenance  Health Maintenance Due  Topic Date Due   OPHTHALMOLOGY EXAM  Never done   DTaP/Tdap/Td (1 - Tdap) Never done   Lung Cancer Screening  Never done   COVID-19 Vaccine (1 - 2024-25 season) Never done   FOOT EXAM  10/31/2023   Colonoscopy  07/05/2024   Health Maintenance Items Addressed: Diabetic Foot Exam recommended, See Nurse Notes at the end of this note  Additional Screening:  Vision Screening: Recommended annual ophthalmology exams for early detection of glaucoma and other disorders of the eye. Would you like a referral to an eye doctor? No    Dental Screening: Recommended annual dental exams for proper oral hygiene  Community Resource Referral / Chronic Care Management: CRR required this visit?  No   CCM required this visit?  No   Plan:    I have personally reviewed and noted the following in the patient's chart:   Medical and social history Use of alcohol, tobacco or illicit drugs  Current medications and supplements including opioid prescriptions. Patient is not currently taking opioid prescriptions. Functional ability and status Nutritional status Physical activity Advanced directives List of other physicians Hospitalizations, surgeries, and ER visits in previous 12 months Vitals Screenings to include cognitive, depression, and falls Referrals and appointments  In addition, I have reviewed and discussed with patient certain preventive protocols, quality metrics, and best practice recommendations. A written personalized care plan for preventive services as well as general preventive health recommendations were provided to patient.   Vina Ned, CMA   05/08/2024   After Visit Summary: (Mail)  Due to this being a telephonic visit, the after visit summary with patients personalized plan was offered to patient via mail   Notes:  6 CIT Score - 11 Needs DM foot exam at next OV on 08/25/24 Needs DM eye exam. Patient declined. Included list of eye doctors in the AVS Tdap UTD (2025) per patient Declined DM & Nutrition education referral Declined covid and shingles vaccines Declined lung cancer screening Declined colonoscopy (due ~07/05/24)

## 2024-06-05 ENCOUNTER — Other Ambulatory Visit: Payer: Self-pay | Admitting: Nurse Practitioner

## 2024-06-05 DIAGNOSIS — J439 Emphysema, unspecified: Secondary | ICD-10-CM

## 2024-06-06 NOTE — Telephone Encounter (Signed)
 Requested Prescriptions  Pending Prescriptions Disp Refills   albuterol  (VENTOLIN  HFA) 108 (90 Base) MCG/ACT inhaler [Pharmacy Med Name: ALBUTEROL  HFA 90 MCG INHALER] 18 g 2    Sig: INHALE 1 TO 2 PUFFS BY MOUTH EVERY 4 HOURS AS NEEDED FOR WHEEZING     Pulmonology:  Beta Agonists 2 Passed - 06/06/2024  5:22 PM      Passed - Last BP in normal range    BP Readings from Last 1 Encounters:  02/13/24 109/68         Passed - Last Heart Rate in normal range    Pulse Readings from Last 1 Encounters:  02/13/24 67         Passed - Valid encounter within last 12 months    Recent Outpatient Visits           3 months ago Atherosclerosis of aorta [facility])   [place] [facility] Melvin Pao, NP   6 months ago Pulmonary emphysema, unspecified emphysema type [facility])   [place] [facility] Melvin Pao, NP   6 months ago 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

## 2024-07-09 ENCOUNTER — Ambulatory Visit: Payer: Self-pay

## 2024-07-09 ENCOUNTER — Emergency Department

## 2024-07-09 ENCOUNTER — Other Ambulatory Visit: Payer: Self-pay

## 2024-07-09 ENCOUNTER — Emergency Department
Admission: EM | Admit: 2024-07-09 | Discharge: 2024-07-09 | Disposition: A | Discharge status: Home / Self Care | Attending: Emergency Medicine | Admitting: Emergency Medicine

## 2024-07-09 DIAGNOSIS — E119 Type 2 diabetes mellitus without complications: Secondary | ICD-10-CM | POA: Insufficient documentation

## 2024-07-09 DIAGNOSIS — R079 Chest pain, unspecified: Secondary | ICD-10-CM | POA: Diagnosis not present

## 2024-07-09 DIAGNOSIS — J449 Chronic obstructive pulmonary disease, unspecified: Secondary | ICD-10-CM | POA: Insufficient documentation

## 2024-07-09 DIAGNOSIS — R0789 Other chest pain: Secondary | ICD-10-CM | POA: Diagnosis not present

## 2024-07-09 DIAGNOSIS — R0602 Shortness of breath: Secondary | ICD-10-CM | POA: Insufficient documentation

## 2024-07-09 LAB — BASIC METABOLIC PANEL WITH GFR
Anion gap: 12 (ref 5–15)
BUN: 22 mg/dL (ref 8–23)
CO2: 19 mmol/L — ABNORMAL LOW (ref 22–32)
Calcium: 9.1 mg/dL (ref 8.9–10.3)
Chloride: 104 mmol/L (ref 98–111)
Creatinine, Ser: 1.69 mg/dL — ABNORMAL HIGH (ref 0.61–1.24)
GFR, Estimated: 43 mL/min — ABNORMAL LOW (ref >60)
Glucose, Bld: 97 mg/dL (ref 70–99)
Potassium: 4.4 mmol/L (ref 3.5–5.1)
Sodium: 135 mmol/L (ref 135–145)

## 2024-07-09 LAB — CBC
HCT: 35.5 % — ABNORMAL LOW (ref 39.0–52.0)
Hemoglobin: 12.1 g/dL — ABNORMAL LOW (ref 13.0–17.0)
MCH: 32.5 pg (ref 26.0–34.0)
MCHC: 34.1 g/dL (ref 30.0–36.0)
MCV: 95.4 fL (ref 80.0–100.0)
Platelets: 189 K/uL (ref 150–400)
RBC: 3.72 MIL/uL — ABNORMAL LOW (ref 4.22–5.81)
RDW: 13.9 % (ref 11.5–15.5)
WBC: 6.3 K/uL (ref 4.0–10.5)
nRBC: 0 % (ref 0.0–0.2)

## 2024-07-09 LAB — TROPONIN I (HIGH SENSITIVITY)
Troponin I (High Sensitivity): 5 ng/L (ref <18)
Troponin I (High Sensitivity): 6 ng/L (ref <18)

## 2024-07-09 MED ORDER — LIDOCAINE 5 % EX PTCH: 1.0000 | MEDICATED_PATCH | CUTANEOUS | 0 refills | Status: AC

## 2024-07-09 MED ORDER — ACETAMINOPHEN 500 MG PO TABS
1000.0000 mg | ORAL_TABLET | Freq: Once | ORAL | Status: AC
  Administered 2024-07-09: 1000 mg via ORAL
  Filled 2024-07-09: qty 2

## 2024-07-09 MED ORDER — LIDOCAINE 5 % EX PTCH
1.0000 | MEDICATED_PATCH | CUTANEOUS | Status: DC
  Administered 2024-07-09: 1 via TRANSDERMAL
  Filled 2024-07-09: qty 1

## 2024-07-09 NOTE — Discharge Instructions (Signed)
 You can take 650 mg of Tylenol every 6 hours as needed for pain, also you will use the Lidoderm  patches as prescribed.  I put in a referral for referral to cardiology.  Otherwise please make sure to follow-up with primary care.

## 2024-07-09 NOTE — ED Provider Notes (Addendum)
 [facility] Provider Note    Event Date/Time   First MD Initiated Contact with Patient 07/09/24 1538     (approximate)   History   Chest Pain   HPI  Terry French is a 73 y.o. male with history of COPD, diabetes, presenting with chest pain.  Started 5 days ago, intermittent.  He denies any new cough or shortness of breath.  No leg swelling.  No recent travel or surgeries, no history of malignancy or blood clots, no hemoptysis.  Denies prior cardiac history.  States that the left chest is tender on palpation but he denies any falls or trauma.  On independent chart review, he is seen by primary care in May, was noted to have an exacerbation of his COPD that day, was given Solu-Medrol , azithromycin , did not tolerate oral prednisone  in the past.  Also given Mucinex.  Has chronic diabetes, A1c 6.5.     Physical Exam   Triage Vital Signs: ED Triage Vitals  Encounter Vitals Group     BP 07/09/24 1402 124/83     Girls Systolic BP Percentile --      Girls Diastolic BP Percentile --      Boys Systolic BP Percentile --      Boys Diastolic BP Percentile --      Pulse Rate 07/09/24 1402 75     Resp 07/09/24 1402 20     Temp 07/09/24 1402 97.7 F (36.5 C)     Temp Source 07/09/24 1402 Oral     SpO2 07/09/24 1402 96 %     Weight 07/09/24 1400 140 lb (63.5 kg)     Height 07/09/24 1400 5' 6 (1.676 m)     Head Circumference --      Peak Flow --      Pain Score 07/09/24 1400 6     Pain Loc --      Pain Education --      Exclude from Growth Chart --     Most recent vital signs: Vitals:   07/09/24 1402  BP: 124/83  Pulse: 75  Resp: 20  Temp: 97.7 F (36.5 C)  SpO2: 96%     General: Awake, no distress.  CV:  Good peripheral perfusion.  Resp:  Normal effort.  Clear, left anterior thoracic cage is tender on palpation Abd:  No distention.  Soft nontender Other:  No unilateral Or Tenderness, No Lower Extremity Edema   ED Results / Procedures /  Treatments   Labs (all labs ordered are listed, but only abnormal results are displayed) Labs Reviewed  BASIC METABOLIC PANEL WITH GFR - Abnormal; Notable for the following components:      Result Value   CO2 19 (*)    Creatinine, Ser 1.69 (*)    GFR, Estimated 43 (*)    All other components within normal limits  CBC - Abnormal; Notable for the following components:   RBC 3.72 (*)    Hemoglobin 12.1 (*)    HCT 35.5 (*)    All other components within normal limits  TROPONIN I (HIGH SENSITIVITY)  TROPONIN I (HIGH SENSITIVITY)     EKG  EKG shows, sinus rhythm, rate 74, normal QS, normal QTc, no obvious ischemic ST elevation, T wave flattening in aVL, not significant change compared to prior   RADIOLOGY On my independent interpretation, chest x-ray without obvious consolidation   PROCEDURES:  Critical Care performed: No  Procedures   MEDICATIONS ORDERED IN ED: Medications  lidocaine  (LIDODERM ) 5 % 1 patch (1 patch Transdermal Patch Applied 07/09/24 1650)  acetaminophen (TYLENOL) tablet 1,000 mg (1,000 mg Oral Given 07/09/24 1650)     IMPRESSION / MDM / ASSESSMENT AND PLAN / ED COURSE  I reviewed the triage vital signs and the nursing notes.                              Differential diagnosis includes, but is not limited to, angina, ACS, musculoskeletal pain, strain, costochondritis, consider PE but he has no other risk factors for it, no infectious symptoms to suggest viral illness or pneumonia.  No wheezing on exam to suggest COPD exacerbation.  Will get labs, EKG, troponin, chest x-ray.  Tylenol, Lidoderm  patch.  Patient's presentation is most consistent with acute presentation with potential threat to life or bodily function.  Independent interpretation of labs and imaging below.  Labs and imaging are reassuring, troponins negative x 2, suspect chest pain is related to musculoskeletal pain since it is reproducible.  On reassessment patient states that his pain is  improving after Lidoderm  patch and Tylenol.    Considered but no indication for inpatient admission at this time, he safe for outpatient management.  His heart score is 3.   Will put in a referral for cardiology since he has not seen one in the past. Will discharge with strict return precautions.  Shared decision making done with patient and he is agreeable with this plan.  The patient is on the cardiac monitor to evaluate for evidence of arrhythmia and/or significant heart rate changes.   Clinical Course as of 07/09/24 1737  Wed Jul 09, 2024  1545 DG Chest 2 View No active cardiopulmonary disease. COPD.  [TT]  1732 Troponin I (High Sensitivity) Troponin x 2 is negative. [TT]    Clinical Course User Index [TT] Waymond, Lorelle Cummins, MD     FINAL CLINICAL IMPRESSION(S) / ED DIAGNOSES   Final diagnoses:  Chest wall pain  Chest pain, unspecified type     Rx / DC Orders   ED Discharge Orders          Ordered    Ambulatory referral to Cardiology       Comments: If you have not heard from the Cardiology office within the next 72 hours please call [phone].   07/09/24 1733    lidocaine  (LIDODERM ) 5 %  Every 24 hours        07/09/24 1733             Note:  This document was prepared using Dragon voice recognition software and may include unintentional dictation errors.    Waymond Lorelle Cummins, MD 07/09/24 1735    Waymond Lorelle Cummins, MD 07/09/24 [phone]

## 2024-07-09 NOTE — Telephone Encounter (Signed)
 FYI Only or Action Required?: FYI only for provider.  Patient was last seen in primary care on 02/13/2024 by Melvin Pao, NP.  Called Nurse Triage reporting Chest Pain.  Symptoms began a week ago.  Interventions attempted: Nothing.  Symptoms are: gradually worsening.  Triage Disposition: Go to ED Now (Notify PCP)  Patient/caregiver understands and will follow disposition?: Yes  **Referred to Ed for evaluation. **       Copied from CRM [id number]. Topic: Clinical - Red Word Triage >> Jul 09, 2024  1:15 PM DeAngela L wrote: Red Word that prompted transfer to Nurse Triage: patient has been having chest pain for a week, coughing more  Sister in [place] on phone and the patient is right next to her   Pt num [phone] (M) Reason for Disposition  [1] Chest pain (or angina) comes and goes AND [2] is happening more often (increasing in frequency) or getting worse (increasing in severity)  (Exception: Chest pains that last only a few seconds.)  Answer Assessment - Initial Assessment Questions 1. LOCATION: Where does it hurt?       Left sided upper chest    2. RADIATION: Does the pain go anywhere else? (e.g., into neck, jaw, arms, back)     No    3. ONSET: When did the chest pain begin? (Minutes, hours or days)      X 5 days; not currently last episode was this morning   4. PATTERN: Does the pain come and go, or has it been constant since it started?  Does it get worse with exertion?      Intermittent    5. DURATION: How long does it last (e.g., seconds, minutes, hours)      15 minutes, sharp pain     6. SEVERITY: How bad is the pain?  (e.g., Scale 1-10; mild, moderate, or severe)     No chest pain currently, however it is happening more frequently   7. CARDIAC RISK FACTORS: Do you have any history of heart problems or risk factors for heart disease? (e.g., angina, prior heart attack; diabetes, high blood pressure, high cholesterol, smoker, or  strong family history of heart disease)     DM 2 (pre)   8. PULMONARY RISK FACTORS: Do you have any history of lung disease?  (e.g., blood clots in lung, asthma, emphysema, birth control pills)     COPD, emphysema    9. CAUSE: What do you think is causing the chest pain?     Unknown    10. OTHER SYMPTOMS: Do you have any other symptoms? (e.g., dizziness, nausea, vomiting, sweating, fever, difficulty breathing, cough)  No.   Referred to Ed for evaluation.  Protocols used: Chest Pain-A-AH

## 2024-07-09 NOTE — ED Triage Notes (Signed)
 Patient states intermittent left sided chest pain for 5 days; denies SOB.

## 2024-07-23 ENCOUNTER — Telehealth: Payer: Self-pay | Admitting: *Deleted

## 2024-07-23 NOTE — Telephone Encounter (Signed)
 Unable to LVM to verify card hx.

## 2024-07-31 ENCOUNTER — Ambulatory Visit: Attending: Cardiology | Admitting: Cardiology

## 2024-07-31 ENCOUNTER — Encounter: Payer: Self-pay | Admitting: Cardiology

## 2024-07-31 VITALS — BP 112/60 | HR 80 | Ht 66.0 in | Wt 151.1 lb

## 2024-07-31 DIAGNOSIS — R072 Precordial pain: Secondary | ICD-10-CM

## 2024-07-31 DIAGNOSIS — F17200 Nicotine dependence, unspecified, uncomplicated: Secondary | ICD-10-CM | POA: Diagnosis not present

## 2024-07-31 DIAGNOSIS — E7800 Pure hypercholesterolemia, unspecified: Secondary | ICD-10-CM | POA: Diagnosis not present

## 2024-07-31 NOTE — Patient Instructions (Signed)
 Medication Instructions:  Your physician recommends that you continue on your current medications as directed. Please refer to the Current Medication list given to you today.   *If you need a refill on your cardiac medications before your next appointment, please call your pharmacy*  Lab Work: No labs ordered today  If you have labs (blood work) drawn today and your tests are completely normal, you will receive your results only by: MyChart Message (if you have MyChart) OR A paper copy in the mail If you have any lab test that is abnormal or we need to change your treatment, we will call you to review the results.  Testing/Procedures: Your physician has requested that you have an echocardiogram. Echocardiography is a painless test that uses sound waves to create images of your heart. It provides your doctor with information about the size and shape of your heart and how well your heart's chambers and valves are working.   You may receive an ultrasound enhancing agent through an IV if needed to better visualize your heart during the echo. This procedure takes approximately one hour.  There are no restrictions for this procedure.  This will take place at [address] [facility] [address]) [address], [place] [address]  Please note: We ask at that you not bring children with you during ultrasound (echo/ vascular) testing. Due to room size and safety concerns, children are not allowed in the ultrasound rooms during exams. Our front office staff cannot provide observation of children in our lobby area while testing is being conducted. An adult accompanying a patient to their appointment will only be allowed in the ultrasound room at the discretion of the ultrasound technician under special circumstances. We apologize for any inconvenience.   Your provider has ordered a Lexiscan/ Exercise Myoview Stress test. This will take place at [facility]. Please report to the [facility] medical mall entrance. The  volunteers at the first desk will direct you where to go.  [facility] MYOVIEW  Your provider has ordered a Stress Test with nuclear imaging. The purpose of this test is to evaluate the blood supply to your heart muscle. This procedure is referred to as a Non-Invasive Stress Test. This is because other than having an IV started in your vein, nothing is inserted or invades your body. Cardiac stress tests are done to find areas of poor blood flow to the heart by determining the extent of coronary artery disease (CAD). Some patients exercise on a treadmill, which naturally increases the blood flow to your heart, while others who are unable to walk on a treadmill due to physical limitations will have a pharmacologic/chemical stress agent called Lexiscan . This medicine will mimic walking on a treadmill by temporarily increasing your coronary blood flow.   Please note: these test may take anywhere between 2-4 hours to complete  How to prepare for your Myoview test:  Nothing to eat for 6 hours prior to the test No caffeine for 24 hours prior to test No smoking 24 hours prior to test. Your medication may be taken with water.  If your doctor stopped a medication because of this test, do not take that medication. Ladies, please do not wear dresses.  Skirts or pants are appropriate. Please wear a short sleeve shirt. No perfume, cologne or lotion. Wear comfortable walking shoes. No heels!   PLEASE NOTIFY THE OFFICE AT LEAST 24 HOURS IN ADVANCE IF YOU ARE UNABLE TO KEEP YOUR APPOINTMENT.  [phone] AND  PLEASE NOTIFY NUCLEAR MEDICINE AT  [facility] AT LEAST 24 HOURS IN ADVANCE IF YOU ARE UNABLE TO KEEP YOUR APPOINTMENT. [phone]   Follow-Up: At [facility], you and your health needs are our priority.  As part of our continuing mission to provide you with exceptional heart care, our providers are all part of one team.  This team includes your primary Cardiologist (physician) and Advanced Practice  Providers or APPs (Physician Assistants and Nurse Practitioners) who all work together to provide you with the care you need, when you need it.  Your next appointment:   4 month(s)  Provider:   You may see Dr Darliss or one of the following Advanced Practice Providers on your designated Care Team:   Lonni Meager, NP Lesley Maffucci, PA-C Bernardino Bring, PA-C Cadence [place], PA-C Tylene Lunch, NP Barnie Hila, NP    We recommend signing up for the patient portal called MyChart.  Sign up information is provided on this After Visit Summary.  MyChart is used to connect with patients for Virtual Visits (Telemedicine).  Patients are able to view lab/test results, encounter notes, upcoming appointments, etc.  Non-urgent messages can be sent to your provider as well.   To learn more about what you can do with MyChart, go to [email or web address].

## 2024-07-31 NOTE — Progress Notes (Signed)
 Cardiology Office Note:    Date:  07/31/2024   ID:  Terry French, Terry French 1950-11-26, MRN [id number]  PCP:  Melvin Pao, NP   [facility] Providers Cardiologist:  None     Referring MD: Waymond Lorelle Cummins, MD   Chief Complaint  Patient presents with   New Patient (Initial Visit)    ED Chest pain no complaints today. Meds reviewed verbally pt.   Terry French is a 73 y.o. male who is being seen today for the evaluation of chest pain at the request of Tan, Lorelle Cummins, MD.   History of Present Illness:    Terry French is a 73 y.o. male with a hx of hyperlipidemia, diabetes, current smoker x 60 years, COPD, CKD presenting with chest pain.  Complains of chest pain x 2 weeks causing him to present to the ED.  Symptoms were on and off, sometimes associated with exertion.  Also endorses shortness of breath with exertion.  Was seen in the ED 3 weeks ago, troponins, EKG were unrevealing.  States doing okay since without symptoms of chest pain.  Denies edema, palpitations, syncope.  Past Medical History:  Diagnosis Date   Asthma    COPD (chronic obstructive pulmonary disease) (HCC)    Emphysema lung (HCC)    Hyperlipidemia     Past Surgical History:  Procedure Laterality Date   BACK SURGERY     COLONOSCOPY WITH PROPOFOL  N/A 07/05/2022   Procedure: COLONOSCOPY WITH PROPOFOL ;  Surgeon: Unk Corinn Skiff, MD;  Location: [facility] ENDOSCOPY;  Service: Gastroenterology;  Laterality: N/A;    Current Medications: Current Meds  Medication Sig   albuterol  (VENTOLIN  HFA) 108 (90 Base) MCG/ACT inhaler INHALE 1 TO 2 PUFFS BY MOUTH EVERY 4 HOURS AS NEEDED FOR WHEEZING   BREZTRI  AEROSPHERE 160-9-4.8 MCG/ACT AERO inhaler Inhale 2 puffs into the lungs in the morning and at bedtime.   rosuvastatin  (CRESTOR ) 20 MG tablet Take 1 tablet (20 mg total) by mouth daily.     Allergies:   Patient has no known allergies.   Social History   Socioeconomic History   Marital status: Divorced    Spouse  name: Not on file   Number of children: 2   Years of education: Not on file   Highest education level: Not on file  Occupational History   Occupation: retired  Tobacco Use   Smoking status: Every Day    Current packs/day: 1.00    Average packs/day: 1 pack/day for 58.8 years (58.8 ttl pk-yrs)    Types: Cigarettes    Start date: 69   Smokeless tobacco: Never  Vaping Use   Vaping status: Never Used  Substance and Sexual Activity   Alcohol use: No    Comment: former alcoholic   Drug use: Not Currently    Frequency: 14.0 times per week    Types: Marijuana    Comment: cocaine use in the past, uses marijuana twice a day   Sexual activity: Yes    Birth control/protection: None, Condom  Other Topics Concern   Not on file  Social History Narrative   Not on file   Social Drivers of Health   Financial Resource Strain: Low Risk  (05/08/2024)   Overall Financial Resource Strain (CARDIA)    Difficulty of Paying Living Expenses: Not hard at all  Food Insecurity: No Food Insecurity (05/08/2024)   Hunger Vital Sign    Worried About Running Out of Food in the Last Year: Never true  Ran Out of Food in the Last Year: Never true  Transportation Needs: No Transportation Needs (05/08/2024)   PRAPARE - Administrator, Civil Service (Medical): No    Lack of Transportation (Non-Medical): No  Physical Activity: Insufficiently Active (05/08/2024)   Exercise Vital Sign    Days of Exercise per Week: 7 days    Minutes of Exercise per Session: 10 min  Stress: Stress Concern Present (05/08/2024)   [employer] of Occupational Health - Occupational Stress Questionnaire    Feeling of Stress: Very much  Social Connections: Socially Isolated (05/08/2024)   Social Connection and Isolation Panel    Frequency of Communication with Friends and Family: Once a week    Frequency of Social Gatherings with Friends and Family: More than three times a week    Attends Religious Services: Never     Database administrator or Organizations: No    Attends Engineer, structural: Never    Marital Status: Divorced     Family History: The patient's family history includes COPD in his father; Cancer in his mother; Diabetes in his brother; Heart attack in his brother.  ROS:   Please see the history of present illness.     All other systems reviewed and are negative.  EKGs/Labs/Other Studies Reviewed:    The following studies were reviewed today:  EKG Interpretation Date/Time:  Thursday July 31 2024 09:19:27 EDT Ventricular Rate:  80 PR Interval:  134 QRS Duration:  82 QT Interval:  404 QTC Calculation: 465 R Axis:   52  Text Interpretation: Normal sinus rhythm Nonspecific ST and T wave abnormality Confirmed by Darliss Rogue ([id number]) on 07/31/2024 9:21:21 AM    Recent Labs: 02/13/2024: ALT 12 07/09/2024: BUN 22; Creatinine, Ser 1.69; Hemoglobin 12.1; Platelets 189; Potassium 4.4; Sodium 135  Recent Lipid Panel    Component Value Date/Time   CHOL 140 02/13/2024 1451   TRIG 100 02/13/2024 1451   HDL 42 02/13/2024 1451   CHOLHDL 3.3 02/13/2024 1451   LDLCALC 79 02/13/2024 1451     Risk Assessment/Calculations:             Physical Exam:    VS:  BP 112/60 (BP Location: Right Arm, Cuff Size: Normal)   Pulse 80   Ht 5' 6 (1.676 m)   Wt 151 lb 2 oz (68.5 kg)   SpO2 98%   BMI 24.39 kg/m     Wt Readings from Last 3 Encounters:  07/31/24 151 lb 2 oz (68.5 kg)  07/09/24 140 lb (63.5 kg)  05/08/24 145 lb (65.8 kg)     GEN:  Well nourished, well developed in no acute distress HEENT: Normal NECK: No JVD; No carotid bruits CARDIAC: RRR, no murmurs, rubs, gallops RESPIRATORY: Diminished breath sounds, no wheezing. ABDOMEN: Soft, non-tender, non-distended MUSCULOSKELETAL:  No edema; No deformity  SKIN: Warm and dry NEUROLOGIC:  Alert and oriented x 3 PSYCHIATRIC:  Normal affect   ASSESSMENT:    1. Precordial pain   2. Pure hypercholesterolemia   3.  Current smoker    PLAN:    In order of problems listed above:  Chest pain, shortness of breath.  Several risk factors.  CKD, COPD.  Obtain echocardiogram, obtain Lexiscan Myoview. Hyperlipidemia, cholesterol controlled.  Continue Crestor  20 mg daily. Current smoker, smoking cessation advised.  Follow-up after cardiac testing.  Informed Consent   Shared Decision Making/Informed Consent The risks [chest pain, shortness of breath, cardiac arrhythmias, dizziness, blood pressure fluctuations, myocardial infarction, stroke/transient  ischemic attack, nausea, vomiting, allergic reaction, radiation exposure, metallic taste sensation and life-threatening complications (estimated to be 1 in 10,000)], benefits (risk stratification, diagnosing coronary artery disease, treatment guidance) and alternatives of a nuclear stress test were discussed in detail with Terry French and he agrees to proceed.        Medication Adjustments/Labs and Tests Ordered: Current medicines are reviewed at length with the patient today.  Concerns regarding medicines are outlined above.  Orders Placed This Encounter  Procedures   NM Myocar Multi W/Spect W/Wall Motion / EF   EKG 12-Lead   ECHOCARDIOGRAM COMPLETE   No orders of the defined types were placed in this encounter.   Patient Instructions  Medication Instructions:  Your physician recommends that you continue on your current medications as directed. Please refer to the Current Medication list given to you today.   *If you need a refill on your cardiac medications before your next appointment, please call your pharmacy*  Lab Work: No labs ordered today  If you have labs (blood work) drawn today and your tests are completely normal, you will receive your results only by: MyChart Message (if you have MyChart) OR A paper copy in the mail If you have any lab test that is abnormal or we need to change your treatment, we will call you to review the  results.  Testing/Procedures: Your physician has requested that you have an echocardiogram. Echocardiography is a painless test that uses sound waves to create images of your heart. It provides your doctor with information about the size and shape of your heart and how well your heart's chambers and valves are working.   You may receive an ultrasound enhancing agent through an IV if needed to better visualize your heart during the echo. This procedure takes approximately one hour.  There are no restrictions for this procedure.  This will take place at [address] [facility] [address]) [address], [place] [address]  Please note: We ask at that you not bring children with you during ultrasound (echo/ vascular) testing. Due to room size and safety concerns, children are not allowed in the ultrasound rooms during exams. Our front office staff cannot provide observation of children in our lobby area while testing is being conducted. An adult accompanying a patient to their appointment will only be allowed in the ultrasound room at the discretion of the ultrasound technician under special circumstances. We apologize for any inconvenience.   Your provider has ordered a Lexiscan/ Exercise Myoview Stress test. This will take place at [facility]. Please report to the [facility] medical mall entrance. The volunteers at the first desk will direct you where to go.  [facility] MYOVIEW  Your provider has ordered a Stress Test with nuclear imaging. The purpose of this test is to evaluate the blood supply to your heart muscle. This procedure is referred to as a Non-Invasive Stress Test. This is because other than having an IV started in your vein, nothing is inserted or invades your body. Cardiac stress tests are done to find areas of poor blood flow to the heart by determining the extent of coronary artery disease (CAD). Some patients exercise on a treadmill, which naturally increases the blood flow to your heart, while others  who are unable to walk on a treadmill due to physical limitations will have a pharmacologic/chemical stress agent called Lexiscan . This medicine will mimic walking on a treadmill by temporarily increasing your coronary blood flow.   Please note: these test may take  anywhere between 2-4 hours to complete  How to prepare for your Myoview test:  Nothing to eat for 6 hours prior to the test No caffeine for 24 hours prior to test No smoking 24 hours prior to test. Your medication may be taken with water.  If your doctor stopped a medication because of this test, do not take that medication. Ladies, please do not wear dresses.  Skirts or pants are appropriate. Please wear a short sleeve shirt. No perfume, cologne or lotion. Wear comfortable walking shoes. No heels!   PLEASE NOTIFY THE OFFICE AT LEAST 24 HOURS IN ADVANCE IF YOU ARE UNABLE TO KEEP YOUR APPOINTMENT.  [phone] AND  PLEASE NOTIFY NUCLEAR MEDICINE AT [facility] AT LEAST 24 HOURS IN ADVANCE IF YOU ARE UNABLE TO KEEP YOUR APPOINTMENT. [phone]   Follow-Up: At [facility], you and your health needs are our priority.  As part of our continuing mission to provide you with exceptional heart care, our providers are all part of one team.  This team includes your primary Cardiologist (physician) and Advanced Practice Providers or APPs (Physician Assistants and Nurse Practitioners) who all work together to provide you with the care you need, when you need it.  Your next appointment:   4 month(s)  Provider:   You may see Dr Darliss or one of the following Advanced Practice Providers on your designated Care Team:   Lonni Meager, NP Lesley Maffucci, PA-C Bernardino Bring, PA-C Cadence [place], PA-C Tylene Lunch, NP Barnie Hila, NP    We recommend signing up for the patient portal called MyChart.  Sign up information is provided on this After Visit Summary.  MyChart is used to connect with patients for Virtual Visits  (Telemedicine).  Patients are able to view lab/test results, encounter notes, upcoming appointments, etc.  Non-urgent messages can be sent to your provider as well.   To learn more about what you can do with MyChart, go to [email or web address].           Signed, Redell Darliss, MD  07/31/2024 10:34 AM    [facility]

## 2024-08-11 ENCOUNTER — Other Ambulatory Visit: Payer: Self-pay | Admitting: Physician Assistant

## 2024-08-11 ENCOUNTER — Ambulatory Visit
Admission: RE | Admit: 2024-08-11 | Discharge: 2024-08-11 | Disposition: A | Discharge status: Home / Self Care | Source: Ambulatory Visit | Attending: Cardiology | Admitting: Cardiology

## 2024-08-11 DIAGNOSIS — R072 Precordial pain: Secondary | ICD-10-CM | POA: Diagnosis not present

## 2024-08-11 LAB — NM MYOCAR MULTI W/SPECT W/WALL MOTION / EF
LV dias vol: 63 mL (ref 62–150)
LV sys vol: 22 mL (ref 4.2–5.8)
MPHR: 149 {beats}/min
Nuc Stress EF: 65 %
Peak HR: 98 {beats}/min
Percent HR: 65 %
Rest HR: 60 {beats}/min
Rest Nuclear Isotope Dose: 10.4 mCi
SDS: 1
SRS: 4
SSS: 3
ST Depression (mm): 0 mm
Stress Nuclear Isotope Dose: 32.8 mCi
TID: 1.02

## 2024-08-11 MED ORDER — TECHNETIUM TC 99M TETROFOSMIN IV KIT
32.8100 | PACK | Freq: Once | INTRAVENOUS | Status: AC | PRN
  Administered 2024-08-11: 32.81 via INTRAVENOUS

## 2024-08-11 MED ORDER — REGADENOSON 0.4 MG/5ML IV SOLN
0.4000 mg | Freq: Once | INTRAVENOUS | Status: AC
  Administered 2024-08-11: 0.4 mg via INTRAVENOUS

## 2024-08-11 MED ORDER — TECHNETIUM TC 99M TETROFOSMIN IV KIT
10.3900 | PACK | Freq: Once | INTRAVENOUS | Status: AC | PRN
Start: 2024-08-11 — End: 2024-08-11
  Administered 2024-08-11: 10.39 via INTRAVENOUS

## 2024-08-11 NOTE — Progress Notes (Signed)
     [name] presented for a  nuclear stress test today.  I Lesley LITTIE Maffucci, PA-C, provided direct supervision and was present during the stress portion of the study today, which was completed without significant symptoms, immediate complications, or acute ST/T changes on ECG.  Stress imaging is pending at this time.  Preliminary ECG findings may be listed in the chart, but the stress test result will not be finalized until perfusion imaging is complete.  Lesley LITTIE Maffucci, PA-C  08/11/2024, 9:26 AM

## 2024-08-12 ENCOUNTER — Ambulatory Visit: Payer: Self-pay | Admitting: Cardiology

## 2024-08-13 ENCOUNTER — Other Ambulatory Visit: Payer: Self-pay

## 2024-08-13 MED ORDER — ASPIRIN 81 MG PO TBEC
81.0000 mg | DELAYED_RELEASE_TABLET | Freq: Every day | ORAL | Status: AC
Start: 2024-08-13 — End: ?

## 2024-08-22 ENCOUNTER — Other Ambulatory Visit: Payer: Self-pay | Admitting: Nurse Practitioner

## 2024-08-24 NOTE — Telephone Encounter (Signed)
 Requested Prescriptions  Pending Prescriptions Disp Refills   rosuvastatin  (CRESTOR ) 20 MG tablet [Pharmacy Med Name: ROSUVASTATIN  CALCIUM  20 MG TAB] 90 tablet 1    Sig: TAKE 1 TABLET BY MOUTH DAILY     Cardiovascular:  Antilipid - Statins 2 Failed - 08/24/2024 12:29 PM      Failed - Cr in normal range and within 360 days    Creatinine, Ser  Date Value Ref Range Status  07/09/2024 1.69 (H) 0.61 - 1.24 mg/dL Final         Failed - Lipid Panel in normal range within the last 12 months    Cholesterol, Total  Date Value Ref Range Status  02/13/2024 140 100 - 199 mg/dL Final   LDL Chol Calc (NIH)  Date Value Ref Range Status  02/13/2024 79 0 - 99 mg/dL Final   HDL  Date Value Ref Range Status  02/13/2024 42 >39 mg/dL Final   Triglycerides  Date Value Ref Range Status  02/13/2024 100 0 - 149 mg/dL Final         Passed - Patient is not pregnant      Passed - Valid encounter within last 12 months    Recent Outpatient Visits           6 months ago Atherosclerosis of aorta   [place] [facility] Melvin Pao, NP   8 months ago Pulmonary emphysema, unspecified emphysema type   [place] [facility] Melvin Pao, NP   9 months ago Moderately severe major depression [facility])   [place] [facility] Melvin Pao, NP       Future Appointments             In 3 months Agbor-Etang, Redell, MD [facility] at [facility]

## 2024-08-25 ENCOUNTER — Ambulatory Visit: Admitting: Nurse Practitioner

## 2024-08-26 ENCOUNTER — Ambulatory Visit (INDEPENDENT_AMBULATORY_CARE_PROVIDER_SITE_OTHER): Admitting: Nurse Practitioner

## 2024-08-26 ENCOUNTER — Encounter: Payer: Self-pay | Admitting: Emergency Medicine

## 2024-08-26 ENCOUNTER — Encounter: Payer: Self-pay | Admitting: Nurse Practitioner

## 2024-08-26 VITALS — BP 116/67 | HR 71 | Temp 94.5°F | Ht 65.98 in | Wt 146.6 lb

## 2024-08-26 DIAGNOSIS — N1831 Chronic kidney disease, stage 3a: Secondary | ICD-10-CM | POA: Diagnosis not present

## 2024-08-26 DIAGNOSIS — Z23 Encounter for immunization: Secondary | ICD-10-CM | POA: Diagnosis not present

## 2024-08-26 DIAGNOSIS — I700 Atherosclerosis of aorta: Secondary | ICD-10-CM

## 2024-08-26 DIAGNOSIS — N183 Chronic kidney disease, stage 3 unspecified: Secondary | ICD-10-CM

## 2024-08-26 DIAGNOSIS — F322 Major depressive disorder, single episode, severe without psychotic features: Secondary | ICD-10-CM | POA: Diagnosis not present

## 2024-08-26 DIAGNOSIS — J439 Emphysema, unspecified: Secondary | ICD-10-CM | POA: Diagnosis not present

## 2024-08-26 DIAGNOSIS — E1122 Type 2 diabetes mellitus with diabetic chronic kidney disease: Secondary | ICD-10-CM | POA: Diagnosis not present

## 2024-08-26 MED ORDER — ALBUTEROL SULFATE HFA 108 (90 BASE) MCG/ACT IN AERS: INHALATION_SPRAY | RESPIRATORY_TRACT | 2 refills | Status: AC

## 2024-08-26 MED ORDER — BREZTRI AEROSPHERE 160-9-4.8 MCG/ACT IN AERO: 2.0000 | INHALATION_SPRAY | Freq: Two times a day (BID) | RESPIRATORY_TRACT | 2 refills | Status: AC

## 2024-08-26 MED ORDER — ROSUVASTATIN CALCIUM 20 MG PO TABS: 20.0000 mg | ORAL_TABLET | Freq: Every day | ORAL | 1 refills | Status: AC

## 2024-08-26 NOTE — Progress Notes (Signed)
 BP 116/67 (BP Location: Right Arm, Patient Position: Sitting, Cuff Size: Normal)   Pulse 71   Temp (!) 94.5 F (34.7 C) (Oral)   Ht 5' 5.98 (1.676 m)   Wt 146 lb 9.6 oz (66.5 kg)   SpO2 97%   BMI 23.67 kg/m    Subjective:    Patient ID: [name], male    DOB: January 19, 1951, 73 y.o.   MRN: [id number]  HPI: TAJAI IHDE is a 73 y.o. male  Chief Complaint  Patient presents with   Chest Pain    Patient states he went to the ER 2 months for chest pain, but he was not admitted.    COPD Seen recently seen in the ER for chest pain. He has followed up with Cardiology and had a stress test.  Now on ASA.   COPD status: stable  He is using his Breztri  and Albuterol . Satisfied with current treatment?: yes Oxygen use: no Dyspnea frequency: daily Cough frequency: daily Rescue inhaler frequency: a couple times a day right now Limitation of activity: no Productive cough: yes Last Spirometry:  Pneumovax: up to date Influenza: Not up to Date  CHRONIC KIDNEY DISEASE CKD status: controlled Medications renally dose: yes Previous renal evaluation: no Pneumovax:  Up to Date Influenza Vaccine:  Not up to Date  DIABETES Hypoglycemic episodes:no Polydipsia/polyuria: no Visual disturbance: no Chest pain: no Paresthesias: no Glucose Monitoring: no  Accucheck frequency: Not Checking  Fasting glucose:  Post prandial:  Evening:  Before meals: Taking Insulin?: no  Long acting insulin:  Short acting insulin: Blood Pressure Monitoring: not checking Retinal Examination: Not up to Date Foot Exam: Up to Date Diabetic Education: Not Completed Pneumovax: Up to Date Influenza: Up to Date Aspirin: no  DEPRESSION Patient states he does feel down, depressed and hopeless. He does not want to take any medication. Denies SI.       08/26/2024    2:51 PM 05/08/2024   11:31 AM 02/13/2024    2:24 PM  PHQ9 SCORE ONLY  PHQ-9 Total Score 15 4  11       Data saved with a previous flowsheet row  definition      08/26/2024    2:51 PM 02/13/2024    2:24 PM 11/15/2023    2:22 PM 10/11/2023   11:39 AM  GAD 7 : Generalized Anxiety Score  Nervous, Anxious, on Edge 0 3 2 3   Control/stop worrying 0 2 3 3   Worry too much - different things 0 3 3 3   Trouble relaxing 0 1 2 3   Restless 0 3 3 3   Easily annoyed or irritable 0 0 3 3  Afraid - awful might happen 0 3 0 3  Total GAD 7 Score 0 15 16 21   Anxiety Difficulty Not difficult at all Very difficult         Relevant past medical, surgical, family and social history reviewed and updated as indicated. Interim medical history since our last visit reviewed. Allergies and medications reviewed and updated.  Review of Systems  Eyes:  Negative for visual disturbance.  Respiratory:  Positive for cough and shortness of breath. Negative for chest tightness.   Cardiovascular:  Negative for chest pain, palpitations and leg swelling.  Endocrine: Negative for polydipsia and polyuria.  Neurological:  Negative for dizziness, light-headedness, numbness and headaches.  Psychiatric/Behavioral:  Positive for dysphoric mood. Negative for suicidal ideas. The patient is nervous/anxious.     Per HPI unless specifically indicated above  Objective:    BP 116/67 (BP Location: Right Arm, Patient Position: Sitting, Cuff Size: Normal)   Pulse 71   Temp (!) 94.5 F (34.7 C) (Oral)   Ht 5' 5.98 (1.676 m)   Wt 146 lb 9.6 oz (66.5 kg)   SpO2 97%   BMI 23.67 kg/m   Wt Readings from Last 3 Encounters:  08/26/24 146 lb 9.6 oz (66.5 kg)  07/31/24 151 lb 2 oz (68.5 kg)  07/09/24 140 lb (63.5 kg)    Physical Exam Vitals and nursing note reviewed.  Constitutional:      General: He is not in acute distress.    Appearance: Normal appearance. He is not ill-appearing, toxic-appearing or diaphoretic.  HENT:     Head: Normocephalic.     Right Ear: External ear normal.     Left Ear: External ear normal.     Nose: Nose normal. No congestion or rhinorrhea.      Mouth/Throat:     Mouth: Mucous membranes are moist.  Eyes:     General:        Right eye: No discharge.        Left eye: No discharge.     Extraocular Movements: Extraocular movements intact.     Conjunctiva/sclera: Conjunctivae normal.     Pupils: Pupils are equal, round, and reactive to light.  Cardiovascular:     Rate and Rhythm: Normal rate and regular rhythm.     Heart sounds: No murmur heard. Pulmonary:     Effort: Pulmonary effort is normal. No respiratory distress.     Breath sounds: Decreased air movement present. No wheezing, rhonchi or rales.  Abdominal:     General: Abdomen is flat. Bowel sounds are normal.  Musculoskeletal:     Cervical back: Normal range of motion and neck supple.  Skin:    General: Skin is warm and dry.     Capillary Refill: Capillary refill takes less than 2 seconds.  Neurological:     General: No focal deficit present.     Mental Status: He is alert and oriented to person, place, and time.  Psychiatric:        Mood and Affect: Mood normal.        Behavior: Behavior normal.        Thought Content: Thought content normal.        Judgment: Judgment normal.     Results for orders placed or performed during the hospital encounter of 08/11/24  NM Myocar Multi W/Spect W/Wall Motion / EF   Collection Time: 08/11/24 10:52 AM  Result Value Ref Range   Rest HR 60.0 bpm   Rest BP 136/89 mmHg   Peak HR 98 bpm   Peak BP 132/74 mmHg   MPHR 149 bpm   Percent HR 65.0 %   ST Depression (mm) 0 mm   Rest Nuclear Isotope Dose 10.4 mCi   Stress Nuclear Isotope Dose 32.8 mCi   SSS 3.0    SRS 4.0    SDS 1.0    TID 1.02    LV sys vol 22.0 4.2 - 5.8 mL   LV dias vol 63.0 62 - 150 mL   Nuc Stress EF 65 %      Assessment & Plan:   Problem List Items Addressed This Visit       Cardiovascular and Mediastinum   Atherosclerosis of aorta (HCC)   Chronic. Continue with Crestor .  Labs ordered today.        Relevant Orders  Lipid Profile      Respiratory   Emphysema lung (HCC) - Primary   Chronic, ongoing. Continue taking Breztri  daily and albuterol  PRN. Refills given today. Referral for lung cancer screening done.       Relevant Medications   Acetaminophen-guaiFENesin (MUCINEX COLD & FLU PO)   Other Relevant Orders   Ambulatory Referral Lung Cancer Screening [facility]   COPD with acute exacerbation (HCC)   Exacerbated at visit today.  Wheezing on exam.  Duoneb done in office today.  Solumedrol 40mg  given in office.  Will treat with azithromycin .  Did not tolerate Oral Prednisone  in the past.  Follow up in 1 week.  Call sooner if concerns arise.       Relevant Medications   Acetaminophen-guaiFENesin (MUCINEX COLD & FLU PO)     Endocrine   Diabetes type 2, controlled (HCC)   Chronic, ongoing. A1C 6.5 today. Diet controlled, not currently on medication regimen. Continue heart healthy diet and exercise of 150 mins weekly. Needs Eye exam and ARB for kidney protection.  Recommend scheduling eye exam with ophthalmologist.       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  Controlled.  Continue with current medication regimen.  Needs ACE/ARB.  Will likely start ARB due to COPD at next visit.  Labs ordered today.  Return to clinic in 6 months for reevaluation.  Call sooner if concerns arise.          Follow up plan: No follow-ups on file.

## 2024-08-26 NOTE — Assessment & Plan Note (Signed)
 Chronic. Does not want medication.  Denies SI.

## 2024-08-26 NOTE — Assessment & Plan Note (Signed)
 Chronic.  Controlled.  Continue with current medication regimen.  Labs ordered today.  Return to clinic in 6 months for reevaluation.  Call sooner if concerns arise.  ? ?

## 2024-08-26 NOTE — Assessment & Plan Note (Signed)
 Chronic. Continue with Rosuvastatin  dose to 20mg  and ASA daily.  Labs ordered today.  Follow up in 6 months.  Call sooner if concerns arise.

## 2024-08-26 NOTE — Assessment & Plan Note (Signed)
 Chronic.  Controlled.  Continue with current medication regimen of Breztri  and PRN albuterol . CT Lung screening ordered.  Labs ordered today.  Return to clinic in 6 months for reevaluation.  Call sooner if concerns arise.

## 2024-08-26 NOTE — Assessment & Plan Note (Signed)
 Chronic.  Controlled.  Last A1c was 6.1%.  On Statin therapy.  Would benefit from ACE/ARB- declines at this time.  Continue with current medication regimen.  Labs ordered today.  Return to clinic in 6 months for reevaluation.  Call sooner if concerns arise.

## 2024-08-27 ENCOUNTER — Ambulatory Visit: Payer: Self-pay | Admitting: Nurse Practitioner

## 2024-08-27 LAB — COMPREHENSIVE METABOLIC PANEL WITH GFR
ALT: 11 IU/L (ref 0–44)
AST: 17 IU/L (ref 0–40)
Albumin: 4 g/dL (ref 3.8–4.8)
Alkaline Phosphatase: 64 IU/L (ref 47–123)
BUN/Creatinine Ratio: 13 (ref 10–24)
BUN: 21 mg/dL (ref 8–27)
Bilirubin Total: 0.3 mg/dL (ref 0.0–1.2)
CO2: 22 mmol/L (ref 20–29)
Calcium: 9.2 mg/dL (ref 8.6–10.2)
Chloride: 101 mmol/L (ref 96–106)
Creatinine, Ser: 1.6 mg/dL — ABNORMAL HIGH (ref 0.76–1.27)
Globulin, Total: 2.4 g/dL (ref 1.5–4.5)
Glucose: 105 mg/dL — ABNORMAL HIGH (ref 70–99)
Potassium: 4.5 mmol/L (ref 3.5–5.2)
Sodium: 137 mmol/L (ref 134–144)
Total Protein: 6.4 g/dL (ref 6.0–8.5)
eGFR: 46 mL/min/1.73 — ABNORMAL LOW (ref >59)

## 2024-08-27 LAB — LIPID PANEL
Chol/HDL Ratio: 3 ratio (ref 0.0–5.0)
Cholesterol, Total: 111 mg/dL (ref 100–199)
HDL: 37 mg/dL — ABNORMAL LOW (ref >39)
LDL Chol Calc (NIH): 53 mg/dL (ref 0–99)
Triglycerides: 114 mg/dL (ref 0–149)
VLDL Cholesterol Cal: 21 mg/dL (ref 5–40)

## 2024-08-27 LAB — HEMOGLOBIN A1C
Est. average glucose Bld gHb Est-mCnc: 134 mg/dL
Hgb A1c MFr Bld: 6.3 % — ABNORMAL HIGH (ref 4.8–5.6)

## 2024-09-10 ENCOUNTER — Telehealth: Payer: Self-pay | Admitting: Acute Care

## 2024-09-10 DIAGNOSIS — Z87891 Personal history of nicotine dependence: Secondary | ICD-10-CM

## 2024-09-10 DIAGNOSIS — F17210 Nicotine dependence, cigarettes, uncomplicated: Secondary | ICD-10-CM

## 2024-09-10 DIAGNOSIS — Z122 Encounter for screening for malignant neoplasm of respiratory organs: Secondary | ICD-10-CM

## 2024-09-10 NOTE — Telephone Encounter (Signed)
 Lung Cancer Screening Narrative/Criteria Questionnaire (Cigarette Smokers Only- No Cigars/Pipes/vapes)   Terry French   SDMV:09/24/24@0930a /[place]                                           08/25/1951              LDCT: 09/26/24@230p /[facility]    73 y.o.   Phone: [phone]  Lung Screening Narrative (confirm age 60-77 yrs Medicare / 50-80 yrs Private pay insurance)   Insurance information:HTA   Referring Provider:[facility]   This screening involves an initial phone call with a team member from our program. It is called a shared decision making visit. The initial meeting is required by insurance and Medicare to make sure you understand the program. This appointment takes about 15-20 minutes to complete. The CT scan will completed at a separate date/time. This scan takes about 5-10 minutes to complete and you may eat and drink before and after the scan.  Criteria questions for Lung Cancer Screening:   Are you a current or former smoker? Current Age began smoking: 15y   If you are a former smoker, what year did you quit smoking? NA   To calculate your smoking history, I need an accurate estimate of how many packs of cigarettes you smoked per day and for how many years. (Not just the number of PPD you are now smoking)   Years smoking 61 x Packs per day 1 = Pack years 61   (at least 20 pack yrs)   (Make sure they understand that we need to know how much they have smoked in the past, not just the number of PPD they are smoking now)  Do you have a personal history of cancer?  No    Do you have a family history of cancer? Yes  (cancer type and and relative) mother/lung  Are you coughing up blood?  No  Have you had unexplained weight loss of 15 lbs or more in the last 6 months? No  It looks like you meet all criteria.     Additional information: N/A

## 2024-09-18 ENCOUNTER — Ambulatory Visit: Attending: Cardiology

## 2024-09-18 DIAGNOSIS — R072 Precordial pain: Secondary | ICD-10-CM | POA: Diagnosis not present

## 2024-09-18 DIAGNOSIS — E7800 Pure hypercholesterolemia, unspecified: Secondary | ICD-10-CM

## 2024-09-18 LAB — ECHOCARDIOGRAM COMPLETE
AR max vel: 3.28 cm2
AV Area VTI: 2.93 cm2
AV Area mean vel: 2.44 cm2
AV Mean grad: 3 mmHg
AV Peak grad: 5.1 mmHg
Ao pk vel: 1.13 m/s
Area-P 1/2: 3.31 cm2
S' Lateral: 2.84 cm

## 2024-09-24 ENCOUNTER — Encounter: Payer: Self-pay | Admitting: Adult Health

## 2024-09-24 ENCOUNTER — Ambulatory Visit: Admitting: Adult Health

## 2024-09-24 DIAGNOSIS — F17210 Nicotine dependence, cigarettes, uncomplicated: Secondary | ICD-10-CM

## 2024-09-24 NOTE — Progress Notes (Signed)
°  Virtual Visit via Telephone Note  I connected with Terry French , 09/24/2024 9:39 AM by a telemedicine application and verified that I am speaking with the correct person using two identifiers.  Location: Patient: home Provider: home   I discussed the limitations of evaluation and management by telemedicine and the availability of in person appointments. The patient expressed understanding and agreed to proceed.   Shared Decision Making Visit Lung Cancer Screening Program [phone])   Eligibility: 73 y.o. Pack Years Smoking History Calculation = 61 pack years  (# packs/per year x # years smoked) Recent History of coughing up blood  no Unexplained weight loss? no ( >Than 15 pounds within the last 6 months ) Prior History Lung / other cancer no (Diagnosis within the last 5 years already requiring surveillance chest CT Scans). Smoking Status Current Smoker  Visit Components: Discussion included one or more decision making aids. YES Discussion included risk/benefits of screening. YES Discussion included potential follow up diagnostic testing for abnormal scans. YES Discussion included meaning and risk of over diagnosis. YES Discussion included meaning and risk of False Positives. YES Discussion included meaning of total radiation exposure. YES  Counseling Included: Importance of adherence to annual lung cancer LDCT screening. YES Impact of comorbidities on ability to participate in the program. YES Ability and willingness to under diagnostic treatment. YES  Smoking Cessation Counseling: Current Smokers:  Discussed importance of smoking cessation. yes Information about tobacco cessation classes and interventions provided to patient. yes Patient provided with ticket for LDCT Scan. yes Symptomatic Patient. NO Diagnosis Code: Tobacco Use Z72.0 Asymptomatic Patient yes  Counseling - 4 minutes of smoking cessation counseling (CT Chest Lung Cancer Screening Low Dose W/O CM)  [id number]  Smoking/Tobacco Cessation Counseling Terry French is a current user of tobacco or nicotine products. He is not ready to quit at this time. Counseling provided today addressed the risks of continued use and the benefits of cessation. Discussed tobacco/nicotine use history, readiness to quit, and evidence-based treatment options including behavioral strategies, support resources, and pharmacologic therapies. Provided encouragement and educational materials on steps and resources to quit smoking. Patient questions were addressed, and follow-up recommended for continued support. Total time spent on counseling: 3 minutes.    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

## 2024-09-24 NOTE — Patient Instructions (Signed)

## 2024-09-26 ENCOUNTER — Ambulatory Visit
Admission: RE | Admit: 2024-09-26 | Discharge: 2024-09-26 | Disposition: A | Discharge status: Home / Self Care | Source: Ambulatory Visit | Attending: Acute Care | Admitting: Acute Care

## 2024-09-26 DIAGNOSIS — Z122 Encounter for screening for malignant neoplasm of respiratory organs: Secondary | ICD-10-CM | POA: Insufficient documentation

## 2024-09-26 DIAGNOSIS — Z87891 Personal history of nicotine dependence: Secondary | ICD-10-CM | POA: Diagnosis present

## 2024-09-26 DIAGNOSIS — F17210 Nicotine dependence, cigarettes, uncomplicated: Secondary | ICD-10-CM | POA: Insufficient documentation

## 2024-10-07 ENCOUNTER — Other Ambulatory Visit: Payer: Self-pay

## 2024-10-07 DIAGNOSIS — F17210 Nicotine dependence, cigarettes, uncomplicated: Secondary | ICD-10-CM

## 2024-10-07 DIAGNOSIS — Z87891 Personal history of nicotine dependence: Secondary | ICD-10-CM

## 2024-10-07 DIAGNOSIS — Z122 Encounter for screening for malignant neoplasm of respiratory organs: Secondary | ICD-10-CM

## 2024-12-01 ENCOUNTER — Ambulatory Visit: Admitting: Cardiology

## 2025-02-23 ENCOUNTER — Ambulatory Visit: Admitting: Nurse Practitioner

## 2025-05-21 ENCOUNTER — Ambulatory Visit
